# Patient Record
Sex: Female | Born: 1945 | Race: White | Hispanic: No | Marital: Married | Smoking: Former smoker
Health system: Southern US, Community
[De-identification: ages and names within clinical notes are randomized; demographics above are authoritative.]

## PROBLEM LIST (undated history)

## (undated) DIAGNOSIS — Z9109 Other allergy status, other than to drugs and biological substances: Secondary | ICD-10-CM

## (undated) DIAGNOSIS — N6310 Unspecified lump in the right breast, unspecified quadrant: Secondary | ICD-10-CM

## (undated) DIAGNOSIS — T7840XA Allergy, unspecified, initial encounter: Secondary | ICD-10-CM

## (undated) DIAGNOSIS — E875 Hyperkalemia: Secondary | ICD-10-CM

## (undated) DIAGNOSIS — M1990 Unspecified osteoarthritis, unspecified site: Secondary | ICD-10-CM

## (undated) DIAGNOSIS — I10 Essential (primary) hypertension: Secondary | ICD-10-CM

## (undated) DIAGNOSIS — R928 Other abnormal and inconclusive findings on diagnostic imaging of breast: Secondary | ICD-10-CM

## (undated) DIAGNOSIS — E785 Hyperlipidemia, unspecified: Secondary | ICD-10-CM

## (undated) DIAGNOSIS — R002 Palpitations: Secondary | ICD-10-CM

## (undated) HISTORY — DX: Other allergy status, other than to drugs and biological substances: Z91.09

## (undated) HISTORY — DX: Essential (primary) hypertension: I10

## (undated) HISTORY — PX: WISDOM TOOTH EXTRACTION: SHX21

## (undated) HISTORY — DX: Other abnormal and inconclusive findings on diagnostic imaging of breast: R92.8

## (undated) HISTORY — DX: Unspecified lump in the right breast, unspecified quadrant: N63.10

## (undated) HISTORY — DX: Allergy, unspecified, initial encounter: T78.40XA

## (undated) HISTORY — DX: Palpitations: R00.2

## (undated) HISTORY — DX: Hyperlipidemia, unspecified: E78.5

## (undated) HISTORY — DX: Hyperkalemia: E87.5

---

## 2016-08-26 DEATH — deceased

## 2020-12-08 ENCOUNTER — Other Ambulatory Visit: Payer: Self-pay

## 2020-12-08 ENCOUNTER — Ambulatory Visit: Payer: Medicare Other | Admitting: Internal Medicine

## 2020-12-08 ENCOUNTER — Encounter: Payer: Self-pay | Admitting: Internal Medicine

## 2020-12-08 VITALS — BP 142/80 | HR 57 | Temp 96.3°F | Ht 65.0 in | Wt 159.0 lb

## 2020-12-08 DIAGNOSIS — I10 Essential (primary) hypertension: Secondary | ICD-10-CM | POA: Diagnosis not present

## 2020-12-08 DIAGNOSIS — E7849 Other hyperlipidemia: Secondary | ICD-10-CM | POA: Diagnosis not present

## 2020-12-10 ENCOUNTER — Other Ambulatory Visit (HOSPITAL_BASED_OUTPATIENT_CLINIC_OR_DEPARTMENT_OTHER): Payer: Self-pay

## 2020-12-10 MED ORDER — ROSUVASTATIN CALCIUM 5 MG PO TABS
ORAL_TABLET | ORAL | 3 refills | Status: DC
  Filled 2021-07-15: qty 90, 90d supply, fill #0

## 2020-12-10 MED ORDER — OLMESARTAN MEDOXOMIL 5 MG PO TABS
ORAL_TABLET | ORAL | 3 refills | Status: DC
  Filled 2020-12-10: qty 90, 90d supply, fill #0
  Filled 2021-07-27: qty 90, 90d supply, fill #1

## 2020-12-10 MED ORDER — NYSTATIN 100000 UNIT/GM EX OINT: TOPICAL_OINTMENT | CUTANEOUS | 2 refills | Status: DC

## 2020-12-11 ENCOUNTER — Other Ambulatory Visit (HOSPITAL_BASED_OUTPATIENT_CLINIC_OR_DEPARTMENT_OTHER): Payer: Self-pay

## 2020-12-14 NOTE — Progress Notes (Signed)
Location:   [facility] features editor of Service:   Clinic  Provider: Einar Crow, MD  Code Status: DNR Goals of Care: No flowsheet data found.   Chief Complaint  Patient presents with   Medical Management of Chronic Issues    Patient here today to establish care    HPI: Patient is a 75 y.o. female seen today for medical management of chronic diseases.   Patient has a history of hypertension and hyperlipidemia Recent move to [facility] [place] Lives with her husband who has a lot of chronic issues and needs her help with transfers. She states that she has some pain in her back and shoulder because of that Also wants to know if she can get a referral to dermatology for her skin Gets mammogram every 2 to 3 years No other complaints.  Very active independent in ADLs and IADLs still drives.  No recent falls  Past Medical History:  Diagnosis Date   Abnormal mammogram    Allergic reaction to substance    Allergy    Environmental allergies    Hyperkalemia    Hyperlipidemia    Hypertension    Intermittent palpitations    Mass of right breast on mammogram    Palpitation     History reviewed. No pertinent surgical history.  No Known Allergies  Outpatient Encounter Medications as of 12/08/2020  Medication Sig   olmesartan (BENICAR) 5 MG tablet Take 5 mg by mouth daily.   rosuvastatin (CRESTOR) 5 MG tablet Take 5 mg by mouth daily.   vitamin B-12 (CYANOCOBALAMIN) 100 MCG tablet Take 100 mcg by mouth daily.   vitamin E 200 UNIT capsule Take 200 Units by mouth daily.   No facility-administered encounter medications on file as of 12/08/2020.    Review of Systems:  Review of Systems Review of Systems  Constitutional: Negative for activity change, appetite change, chills, diaphoresis, fatigue and fever.  HENT: Negative for mouth sores, postnasal drip, rhinorrhea, sinus pain and sore throat.   Respiratory: Negative for apnea, cough, chest tightness,  shortness of breath and wheezing.   Cardiovascular: Negative for chest pain, palpitations and leg swelling.  Gastrointestinal: Negative for abdominal distention, abdominal pain, constipation, diarrhea, nausea and vomiting.  Genitourinary: Negative for dysuria and frequency.  Musculoskeletal: Negative for arthralgias, joint swelling and myalgias.  Skin: Negative for rash.  Neurological: Negative for dizziness, syncope, weakness, light-headedness and numbness.  Psychiatric/Behavioral: Negative for behavioral problems, confusion and sleep disturbance.    Health Maintenance  Topic Date Due   COVID-19 Vaccine (1) Never done   Hepatitis C Screening  Never done   TETANUS/TDAP  Never done   Zoster Vaccines- Shingrix (1 of 2) Never done   DEXA SCAN  Never done   PNA vac Low Risk Adult (1 of 2 - PCV13) Never done   INFLUENZA VACCINE  01/26/2021   HPV VACCINES  Aged Out    Physical Exam: Vitals:   12/08/20 1407  BP: (!) 142/80  Pulse: (!) 57  Temp: (!) 96.3 F (35.7 C)  SpO2: 96%  Weight: 159 lb (72.1 kg)  Height: 5\' 5"  (1.651 m)   Body mass index is 26.46 kg/m. Physical Exam Constitutional: Oriented to person, place, and time. Well-developed and well-nourished.  HENT:  Head: Normocephalic.  Mouth/Throat: Oropharynx is clear and moist.  Eyes: Pupils are equal, round, and reactive to light.  Neck: Neck supple.  Cardiovascular: Normal rate and normal heart sounds.  No murmur heard. Pulmonary/Chest: Effort normal  and breath sounds normal. No respiratory distress. No wheezes. She has no rales.  Abdominal: Soft. Bowel sounds are normal. No distension. There is no tenderness. There is no rebound.  Musculoskeletal: No edema.  Lymphadenopathy: none Neurological: Alert and oriented to person, place, and time.  Skin: Skin is warm and dry.  Psychiatric: Normal mood and affect. Behavior is normal. Thought content normal.   Labs reviewed: Basic Metabolic Panel: No results for input(s):  NA, K, CL, CO2, GLUCOSE, BUN, CREATININE, CALCIUM, MG, PHOS, TSH in the last 8760 hours. Liver Function Tests: No results for input(s): AST, ALT, ALKPHOS, BILITOT, PROT, ALBUMIN in the last 8760 hours. No results for input(s): LIPASE, AMYLASE in the last 8760 hours. No results for input(s): AMMONIA in the last 8760 hours. CBC: No results for input(s): WBC, NEUTROABS, HGB, HCT, MCV, PLT in the last 8760 hours. Lipid Panel: No results for input(s): CHOL, HDL, LDLCALC, TRIG, CHOLHDL, LDLDIRECT in the last 8760 hours. No results found for: HGBA1C  Procedures since last visit: No results found.  Assessment/Plan  Primary hypertension Mildly high as she did not take her meds today Continue Benicar for now  HLD On Crestor ACP Patient wants to be DNR  Referal to dermatology in facility made Labs ordered Needs Record of her Immunization   Labs/tests ordered:  * No order type specified * Next appt:  Visit date not found

## 2021-01-14 ENCOUNTER — Other Ambulatory Visit (HOSPITAL_BASED_OUTPATIENT_CLINIC_OR_DEPARTMENT_OTHER): Payer: Self-pay

## 2021-01-14 MED ORDER — ZEASORB-AF 2 % EX POWD
Freq: Every day | CUTANEOUS | 6 refills | Status: DC
  Filled 2021-01-14: qty 71, 14d supply, fill #0
  Filled 2021-05-04: qty 71, 14d supply, fill #1
  Filled 2021-11-13: qty 71, 14d supply, fill #2

## 2021-01-14 MED ORDER — KETOCONAZOLE 2 % EX CREA
TOPICAL_CREAM | CUTANEOUS | 2 refills | Status: DC
  Filled 2021-01-14: qty 30, 14d supply, fill #0

## 2021-01-14 MED ORDER — HYDROCORTISONE 2.5 % EX CREA
TOPICAL_CREAM | CUTANEOUS | 1 refills | Status: DC
  Filled 2021-01-14: qty 30, 14d supply, fill #0
  Filled 2021-11-22: qty 30, 14d supply, fill #1

## 2021-03-11 ENCOUNTER — Other Ambulatory Visit (HOSPITAL_BASED_OUTPATIENT_CLINIC_OR_DEPARTMENT_OTHER): Payer: Self-pay

## 2021-03-11 MED ORDER — AMMONIUM LACTATE 12 % EX LOTN
TOPICAL_LOTION | CUTANEOUS | 5 refills | Status: DC
  Filled 2021-03-11: qty 226, 14d supply, fill #0

## 2021-03-11 MED ORDER — CERAVE EX CREA: TOPICAL_CREAM | CUTANEOUS | 5 refills | Status: DC

## 2021-03-12 ENCOUNTER — Other Ambulatory Visit (HOSPITAL_BASED_OUTPATIENT_CLINIC_OR_DEPARTMENT_OTHER): Payer: Self-pay

## 2021-03-19 ENCOUNTER — Other Ambulatory Visit (HOSPITAL_BASED_OUTPATIENT_CLINIC_OR_DEPARTMENT_OTHER): Payer: Self-pay

## 2021-03-23 ENCOUNTER — Other Ambulatory Visit (HOSPITAL_BASED_OUTPATIENT_CLINIC_OR_DEPARTMENT_OTHER): Payer: Self-pay

## 2021-03-30 ENCOUNTER — Other Ambulatory Visit (HOSPITAL_BASED_OUTPATIENT_CLINIC_OR_DEPARTMENT_OTHER): Payer: Self-pay

## 2021-04-03 ENCOUNTER — Other Ambulatory Visit (HOSPITAL_BASED_OUTPATIENT_CLINIC_OR_DEPARTMENT_OTHER): Payer: Self-pay

## 2021-04-29 ENCOUNTER — Other Ambulatory Visit: Payer: Self-pay

## 2021-05-04 ENCOUNTER — Other Ambulatory Visit (HOSPITAL_BASED_OUTPATIENT_CLINIC_OR_DEPARTMENT_OTHER): Payer: Self-pay

## 2021-05-13 ENCOUNTER — Other Ambulatory Visit (HOSPITAL_BASED_OUTPATIENT_CLINIC_OR_DEPARTMENT_OTHER): Payer: Self-pay

## 2021-05-13 MED ORDER — NYSTATIN 100000 UNIT/GM EX CREA
TOPICAL_CREAM | CUTANEOUS | 0 refills | Status: DC
  Filled 2021-05-13: qty 45, 45d supply, fill #0

## 2021-05-25 ENCOUNTER — Other Ambulatory Visit (HOSPITAL_BASED_OUTPATIENT_CLINIC_OR_DEPARTMENT_OTHER): Payer: Self-pay

## 2021-06-08 ENCOUNTER — Other Ambulatory Visit: Payer: Self-pay

## 2021-06-08 ENCOUNTER — Encounter: Payer: Self-pay | Admitting: Adult Health

## 2021-06-08 ENCOUNTER — Non-Acute Institutional Stay: Payer: Medicare Other | Admitting: Adult Health

## 2021-06-08 VITALS — BP 124/86 | HR 71 | Temp 96.3°F | Ht 65.0 in | Wt 164.2 lb

## 2021-06-08 DIAGNOSIS — H9313 Tinnitus, bilateral: Secondary | ICD-10-CM

## 2021-06-08 DIAGNOSIS — J392 Other diseases of pharynx: Secondary | ICD-10-CM

## 2021-06-08 DIAGNOSIS — T148XXA Other injury of unspecified body region, initial encounter: Secondary | ICD-10-CM

## 2021-06-08 DIAGNOSIS — E7849 Other hyperlipidemia: Secondary | ICD-10-CM

## 2021-06-08 DIAGNOSIS — I10 Essential (primary) hypertension: Secondary | ICD-10-CM

## 2021-06-08 NOTE — Progress Notes (Signed)
Location: [facility]  POS: clinic  Provider:  Peggye Ley, ANP [facility] [phone]   Code Status: DNR  Goals of Care: No flowsheet data found.   Chief Complaint  Patient presents with   Medical Management of Chronic Issues    Patient returns to the clinic for her 6 month follow up.      HPI: Patient is a 75 y.o. female seen today for medical management of chronic diseases.    PMH significant for HTN and HLD.   She is having some ringing in her ears not sure for how long, maybe a year, mostly at night. No hearing aides and no hearing loss.  She a mild cold with nasal congestion. Neg covid.  She is concerned that her husband has covid and he had a fall. She is adjusting to living alone while her husband lives in skilled care.   Reports that while pushing her husband in the [facility] she pulled a muscle in her right leg and there is some soreness there.    Past Medical History:  Diagnosis Date   Abnormal mammogram    Allergic reaction to substance    Allergy    Environmental allergies    Hyperkalemia    Hyperlipidemia    Hypertension    Intermittent palpitations    Mass of right breast on mammogram    Palpitation     History reviewed. No pertinent surgical history.  No Known Allergies  Outpatient Encounter Medications as of 06/08/2021  Medication Sig   ammonium lactate (LAC-HYDRIN) 12 % lotion Apply topically.   Emollient (CERAVE) CREA Apply to face and body 1-2 times daily   hydrocortisone 2.5 % cream Cleanse affected areas with gentle nonperfumed cleanser, pat dry or use cool dryer, then apply peasize amount twice a day for two weeks then daily as needed with kenoconazole.   ketoconazole (NIZORAL) 2 % cream Cleanse with gentle cleanser and washcloth and dry completely then apply to afffected area peasize amount twice a day under breasts and groin for 2 weeks and with flares   miconazole (ZEASORB-AF) 2 % powder Apply daily to affected area.    nystatin cream (MYCOSTATIN) Apply to affected area twice daily for 2 weeks or until improved; can use as needed afterwards.   nystatin ointment (MYCOSTATIN) Apply on affected areas under the breasts and groin area twice daily   olmesartan (BENICAR) 5 MG tablet Take 1 tablet by mouth daily   rosuvastatin (CRESTOR) 5 MG tablet Take 1 tablet by mouth everyday at bedtime   vitamin B-12 (CYANOCOBALAMIN) 100 MCG tablet Take 100 mcg by mouth daily.   vitamin E 200 UNIT capsule Take 200 Units by mouth daily.   [DISCONTINUED] olmesartan (BENICAR) 5 MG tablet Take 5 mg by mouth daily.   [DISCONTINUED] rosuvastatin (CRESTOR) 5 MG tablet Take 5 mg by mouth daily.   No facility-administered encounter medications on file as of 06/08/2021.    Review of Systems:  Review of Systems  Constitutional:  Negative for activity change, appetite change, chills, diaphoresis, fatigue, fever and unexpected weight change.  HENT:  Positive for congestion.        Dry throat  Respiratory:  Negative for cough, shortness of breath and wheezing.   Cardiovascular:  Negative for chest pain, palpitations and leg swelling.  Gastrointestinal:  Negative for abdominal distention, abdominal pain, constipation and diarrhea.  Genitourinary:  Negative for difficulty urinating and dysuria.  Musculoskeletal:  Negative for arthralgias, back pain, gait problem and joint swelling.  Right upper leg pain  Neurological:  Negative for dizziness, tremors, seizures, syncope, facial asymmetry, speech difficulty, weakness, light-headedness, numbness and headaches.  Psychiatric/Behavioral:  Negative for agitation, behavioral problems and confusion.    Health Maintenance  Topic Date Due   Hepatitis C Screening  Never done   TETANUS/TDAP  12/08/2016   DEXA SCAN  06/08/2024 (Originally 08/31/2008)   Pneumonia Vaccine 62+ Years old  Completed   INFLUENZA VACCINE  Completed   COVID-19 Vaccine  Completed   Zoster Vaccines- Shingrix  Completed    HPV VACCINES  Aged Out    Physical Exam: Vitals:   06/08/21 1353  BP: 124/86  Pulse: 71  Temp: (!) 96.3 F (35.7 C)  SpO2: 94%  Weight: 164 lb 3.2 oz (74.5 kg)  Height: 5\' 5"  (1.651 m)   Body mass index is 27.32 kg/m. Physical Exam Vitals and nursing note reviewed.  Constitutional:      General: She is not in acute distress.    Appearance: She is not diaphoretic.  HENT:     Head: Normocephalic and atraumatic.     Right Ear: Tympanic membrane normal.     Left Ear: Tympanic membrane normal.     Ears:     Comments: Mild erythema to surrounding TM     Nose: Congestion present.     Mouth/Throat:     Mouth: Mucous membranes are moist.     Pharynx: Oropharynx is clear.  Eyes:     Conjunctiva/sclera: Conjunctivae normal.     Pupils: Pupils are equal, round, and reactive to light.  Neck:     Vascular: No JVD.  Cardiovascular:     Rate and Rhythm: Normal rate and regular rhythm.     Heart sounds: No murmur heard. Pulmonary:     Effort: Pulmonary effort is normal. No respiratory distress.     Breath sounds: Normal breath sounds. No wheezing.  Abdominal:     General: Bowel sounds are normal. There is no distension.     Palpations: Abdomen is soft. There is no mass.     Tenderness: There is no abdominal tenderness. There is no right CVA tenderness or guarding.  Musculoskeletal:     Cervical back: No rigidity or tenderness.     Right lower leg: No edema.     Left lower leg: No edema.  Lymphadenopathy:     Cervical: No cervical adenopathy.  Skin:    General: Skin is warm and dry.  Neurological:     Mental Status: She is alert and oriented to person, place, and time.  Psychiatric:        Mood and Affect: Mood normal.    Labs reviewed: Basic Metabolic Panel: No results for input(s): NA, K, CL, CO2, GLUCOSE, BUN, CREATININE, CALCIUM, MG, PHOS, TSH in the last 8760 hours. Liver Function Tests: No results for input(s): AST, ALT, ALKPHOS, BILITOT, PROT, ALBUMIN in the  last 8760 hours. No results for input(s): LIPASE, AMYLASE in the last 8760 hours. No results for input(s): AMMONIA in the last 8760 hours. CBC: No results for input(s): WBC, NEUTROABS, HGB, HCT, MCV, PLT in the last 8760 hours. Lipid Panel: No results for input(s): CHOL, HDL, LDLCALC, TRIG, CHOLHDL, LDLDIRECT in the last 8760 hours. No results found for: HGBA1C  Procedures since last visit: No results found.  Assessment/Plan  1. Primary hypertension Controlled Continue Benicar  2. Other hyperlipidemia Check lipid  Continue crestor   3. Tinnitus of both ears No cerumen impaction or infection noted. Mild fluid  from current URI but the issue has been present for over a year.  - Ambulatory referral to ENT  4. Muscle strain To right groin area  Continues stretching  Try advil OTC for 3-5 days with food then stop If not improving RTC  5. Dry throat No signs of reflux, may be due to long periods of reading to her husband   Labs/tests ordered:  * No order type specified * CBC CMP TSH lipid this week Next appt:  F/U 6 months   Total time :  time greater than 50% of total time spent doing pt counseling and coordination of care   She will verify her vaccination records and get back with [place]

## 2021-06-09 ENCOUNTER — Other Ambulatory Visit (HOSPITAL_BASED_OUTPATIENT_CLINIC_OR_DEPARTMENT_OTHER): Payer: Self-pay

## 2021-06-11 LAB — BASIC METABOLIC PANEL
BUN: 14 (ref 4–21)
CO2: 25 — AB (ref 13–22)
Chloride: 104 (ref 99–108)
Creatinine: 0.8 (ref 0.5–1.1)
Glucose: 102
Potassium: 4.2 mEq/L (ref 3.5–5.1)
Sodium: 141 (ref 137–147)

## 2021-06-11 LAB — LIPID PANEL
Cholesterol: 144 (ref 0–200)
HDL: 36 (ref 35–70)
LDL Cholesterol: 77
Triglycerides: 152 (ref 40–160)

## 2021-06-11 LAB — CBC AND DIFFERENTIAL
HCT: 44 (ref 36–46)
Hemoglobin: 14.2 (ref 12.0–16.0)
Platelets: 186 10*3/uL (ref 150–400)
WBC: 4.2

## 2021-06-11 LAB — COMPREHENSIVE METABOLIC PANEL
Albumin: 4.4 (ref 3.5–5.0)
Calcium: 9.1 (ref 8.7–10.7)
Globulin: 1.9

## 2021-06-11 LAB — CBC: RBC: 4.8 (ref 3.87–5.11)

## 2021-06-11 LAB — HEPATIC FUNCTION PANEL
ALT: 26 U/L (ref 7–35)
AST: 22 (ref 13–35)
Alkaline Phosphatase: 87 (ref 25–125)
Bilirubin, Total: 0.7

## 2021-06-11 LAB — TSH: TSH: 5.49 (ref 0.41–5.90)

## 2021-07-14 ENCOUNTER — Other Ambulatory Visit (HOSPITAL_BASED_OUTPATIENT_CLINIC_OR_DEPARTMENT_OTHER): Payer: Self-pay

## 2021-07-14 MED ORDER — TRIAMCINOLONE ACETONIDE 0.1 % EX CREA
TOPICAL_CREAM | CUTANEOUS | 1 refills | Status: DC
  Filled 2021-07-14: qty 30, 14d supply, fill #0
  Filled 2021-11-22: qty 30, 14d supply, fill #1

## 2021-07-14 MED ORDER — TACROLIMUS 0.1 % EX OINT
TOPICAL_OINTMENT | CUTANEOUS | 1 refills | Status: DC
  Filled 2021-07-14: qty 30, 14d supply, fill #0

## 2021-07-15 ENCOUNTER — Other Ambulatory Visit (HOSPITAL_BASED_OUTPATIENT_CLINIC_OR_DEPARTMENT_OTHER): Payer: Self-pay

## 2021-07-22 ENCOUNTER — Other Ambulatory Visit (HOSPITAL_BASED_OUTPATIENT_CLINIC_OR_DEPARTMENT_OTHER): Payer: Self-pay

## 2021-07-27 ENCOUNTER — Other Ambulatory Visit (HOSPITAL_BASED_OUTPATIENT_CLINIC_OR_DEPARTMENT_OTHER): Payer: Self-pay

## 2021-08-11 ENCOUNTER — Other Ambulatory Visit: Payer: Self-pay | Admitting: Physician Assistant

## 2021-08-11 ENCOUNTER — Ambulatory Visit
Admission: RE | Admit: 2021-08-11 | Discharge: 2021-08-11 | Disposition: A | Discharge status: Home / Self Care | Payer: Medicare Other | Source: Ambulatory Visit | Attending: Physician Assistant | Admitting: Physician Assistant

## 2021-08-11 DIAGNOSIS — M25551 Pain in right hip: Secondary | ICD-10-CM

## 2021-10-26 ENCOUNTER — Other Ambulatory Visit: Payer: Self-pay

## 2021-11-12 ENCOUNTER — Other Ambulatory Visit (HOSPITAL_BASED_OUTPATIENT_CLINIC_OR_DEPARTMENT_OTHER): Payer: Self-pay

## 2021-11-12 MED ORDER — SODIUM FLUORIDE 1.1 % DT PSTE
PASTE | DENTAL | 4 refills | Status: AC
  Filled 2021-11-12: qty 100, 30d supply, fill #0

## 2021-11-13 ENCOUNTER — Other Ambulatory Visit (HOSPITAL_BASED_OUTPATIENT_CLINIC_OR_DEPARTMENT_OTHER): Payer: Self-pay

## 2021-11-13 ENCOUNTER — Other Ambulatory Visit: Payer: Self-pay | Admitting: Internal Medicine

## 2021-11-13 MED ORDER — OLMESARTAN MEDOXOMIL 5 MG PO TABS
ORAL_TABLET | ORAL | 3 refills | Status: DC
  Filled 2021-11-13: qty 90, 90d supply, fill #0
  Filled 2022-02-25: qty 90, 90d supply, fill #1
  Filled 2022-06-08: qty 90, 90d supply, fill #2
  Filled 2022-09-10: qty 90, 90d supply, fill #3

## 2021-11-13 MED ORDER — ROSUVASTATIN CALCIUM 5 MG PO TABS
ORAL_TABLET | ORAL | 3 refills | Status: DC
  Filled 2021-11-13: qty 90, 90d supply, fill #0
  Filled 2022-02-25: qty 90, 90d supply, fill #1
  Filled 2022-06-08: qty 90, 90d supply, fill #2
  Filled 2022-09-10: qty 90, 90d supply, fill #3

## 2021-11-13 NOTE — Telephone Encounter (Signed)
Patient has request refill on medications Olmesartan last refill 09/04/2020, and Crestor last refill 09/16/2020. Medications havent been refilled by PCP Mahlon Gammon, MD . Medications pend and sent to Fletcher Anon, NP for approval.

## 2021-11-24 ENCOUNTER — Other Ambulatory Visit (HOSPITAL_BASED_OUTPATIENT_CLINIC_OR_DEPARTMENT_OTHER): Payer: Self-pay

## 2021-12-07 ENCOUNTER — Other Ambulatory Visit (HOSPITAL_BASED_OUTPATIENT_CLINIC_OR_DEPARTMENT_OTHER): Payer: Self-pay

## 2021-12-08 ENCOUNTER — Encounter: Payer: Self-pay | Admitting: Internal Medicine

## 2021-12-09 ENCOUNTER — Encounter: Payer: Self-pay | Admitting: Internal Medicine

## 2021-12-09 ENCOUNTER — Non-Acute Institutional Stay (INDEPENDENT_AMBULATORY_CARE_PROVIDER_SITE_OTHER): Payer: Self-pay | Admitting: Internal Medicine

## 2021-12-09 VITALS — Ht 65.0 in

## 2021-12-09 DIAGNOSIS — Z91199 Patient's noncompliance with other medical treatment and regimen due to unspecified reason: Secondary | ICD-10-CM

## 2021-12-11 NOTE — Progress Notes (Signed)
No Show

## 2022-02-25 ENCOUNTER — Other Ambulatory Visit (HOSPITAL_BASED_OUTPATIENT_CLINIC_OR_DEPARTMENT_OTHER): Payer: Self-pay

## 2022-03-30 ENCOUNTER — Other Ambulatory Visit (HOSPITAL_BASED_OUTPATIENT_CLINIC_OR_DEPARTMENT_OTHER): Payer: Self-pay

## 2022-03-30 MED ORDER — FLUAD QUADRIVALENT 0.5 ML IM PRSY
PREFILLED_SYRINGE | INTRAMUSCULAR | 0 refills | Status: DC
Start: 2022-03-30 — End: 2023-07-07
  Filled 2022-03-30: qty 0.5, 1d supply, fill #0

## 2022-06-08 ENCOUNTER — Other Ambulatory Visit: Payer: Self-pay

## 2022-07-14 ENCOUNTER — Other Ambulatory Visit (HOSPITAL_BASED_OUTPATIENT_CLINIC_OR_DEPARTMENT_OTHER): Payer: Self-pay

## 2022-07-14 MED ORDER — PREDNISONE 10 MG PO TABS
10.0000 mg | ORAL_TABLET | ORAL | 0 refills | Status: DC
  Filled 2022-07-14: qty 18, 7d supply, fill #0

## 2022-07-22 ENCOUNTER — Other Ambulatory Visit (HOSPITAL_BASED_OUTPATIENT_CLINIC_OR_DEPARTMENT_OTHER): Payer: Self-pay

## 2022-07-22 MED ORDER — TRAMADOL HCL 50 MG PO TABS
50.0000 mg | ORAL_TABLET | Freq: Four times a day (QID) | ORAL | 0 refills | Status: DC | PRN
  Filled 2022-07-22: qty 20, 5d supply, fill #0

## 2022-07-26 ENCOUNTER — Other Ambulatory Visit (HOSPITAL_BASED_OUTPATIENT_CLINIC_OR_DEPARTMENT_OTHER): Payer: Self-pay

## 2022-07-26 MED ORDER — PREDNISONE 10 MG PO TABS
ORAL_TABLET | ORAL | 0 refills | Status: AC
  Filled 2022-07-26: qty 18, 7d supply, fill #0

## 2022-08-27 ENCOUNTER — Encounter: Payer: Self-pay | Admitting: Internal Medicine

## 2022-08-27 ENCOUNTER — Ambulatory Visit: Payer: Medicare Other | Admitting: Internal Medicine

## 2022-08-27 VITALS — BP 143/80 | HR 71 | Resp 17 | Ht 65.0 in | Wt 170.0 lb

## 2022-08-27 DIAGNOSIS — R002 Palpitations: Secondary | ICD-10-CM

## 2022-08-27 DIAGNOSIS — R9431 Abnormal electrocardiogram [ECG] [EKG]: Secondary | ICD-10-CM

## 2022-08-27 DIAGNOSIS — I10 Essential (primary) hypertension: Secondary | ICD-10-CM

## 2022-08-27 NOTE — Progress Notes (Signed)
Primary Physician/Referring:  Mahlon Gammon, MD  Patient ID: Chloe Cobb, female    DOB: 1945-12-07, 77 y.o.   MRN: [id number]  Chief Complaint  Patient presents with   Abnormal ECG   New Patient (Initial Visit)   HPI:    Chloe Cobb  is a 77 y.o. female with past medical history significant for hypertension, hyperlipidemia, and palpitations who is here to establish care with cardiology.  She was sent to [place] from her primary care doctor's office due to abnormal EKG.  One of her EKGs showed marked sinus bradycardia and the other showed possible atrial fibrillation.  Patient does complain of irregular heart rates and palpitations.  Of note heart disease does run in her family and her sister has atrial fibrillation, so does her husband so she is familiar with this.  Patient denies chest pain, shortness of breath, diaphoresis, syncope, edema, orthopnea, PND, claudication.  She has not noticed any fast heart rates that have been sustained.  Past Medical History:  Diagnosis Date   Abnormal mammogram    Allergic reaction to substance    Allergy    Environmental allergies    Hyperkalemia    Hyperlipidemia    Hypertension    Intermittent palpitations    Mass of right breast on mammogram    Palpitation    No past surgical history on file. Family History  Problem Relation Age of Onset   Heart attack Mother    Parkinson's disease Mother    Heart disease Father    Coronary artery disease Father    Heart disease Sister     Social History   Tobacco Use   Smoking status: Former    Types: Cigarettes    Quit date: 1986    Years since quitting: 38.1   Smokeless tobacco: Not on file  Substance Use Topics   Alcohol use: Yes    Comment: 7 a week   Marital Status: Married  ROS  Review of Systems  Cardiovascular:  Positive for irregular heartbeat and palpitations.   Objective  Blood pressure (!) 143/80, pulse 71, resp. rate 17, height 5\' 5"  (1.651 m), weight 170 lb (77.1 kg),  SpO2 97 %. Body mass index is 28.29 kg/m.     08/27/2022    1:17 PM 12/09/2021    8:12 AM 06/08/2021    1:53 PM  Vitals with BMI  Height 5\' 5"  5\' 5"  5\' 5"   Weight 170 lbs  164 lbs 3 oz  BMI 28.29  27.32  Systolic 143  124  Diastolic 80  86  Pulse 71  71     Physical Exam Vitals reviewed.  HENT:     Head: Normocephalic and atraumatic.  Cardiovascular:     Rate and Rhythm: Regular rhythm. Bradycardia present.     Pulses: Normal pulses.     Heart sounds: Normal heart sounds. No murmur heard. Pulmonary:     Effort: Pulmonary effort is normal.     Breath sounds: Normal breath sounds.  Abdominal:     General: Bowel sounds are normal.  Musculoskeletal:     Right lower leg: No edema.     Left lower leg: No edema.  Skin:    General: Skin is warm and dry.  Neurological:     Mental Status: She is alert.     Medications and allergies  No Known Allergies   Medication list after today's encounter   Current Outpatient Medications:    ammonium lactate (LAC-HYDRIN) 12 %  lotion, Apply topically., Disp: 226 g, Rfl: 5   Emollient (CERAVE) CREA, Apply to face and body 1-2 times daily, Disp: 454 g, Rfl: 5   hydrocortisone 2.5 % cream, Cleanse affected areas with gentle nonperfumed cleanser, pat dry or use cool dryer, then apply peasize amount twice a day for two weeks then daily as needed with kenoconazole., Disp: 30 g, Rfl: 1   influenza vaccine adjuvanted (FLUAD QUADRIVALENT) 0.5 ML injection, Inject into the muscle., Disp: 0.5 mL, Rfl: 0   ketoconazole (NIZORAL) 2 % cream, Cleanse with gentle cleanser and washcloth and dry completely then apply to afffected area peasize amount twice a day under breasts and groin for 2 weeks and with flares, Disp: 30 g, Rfl: 2   miconazole (ZEASORB-AF) 2 % powder, Apply daily to affected area., Disp: 71 g, Rfl: 6   olmesartan (BENICAR) 5 MG tablet, Take 1 tablet by mouth daily, Disp: 90 tablet, Rfl: 3   rosuvastatin (CRESTOR) 5 MG tablet, Take 1 tablet by  mouth everyday at bedtime, Disp: 90 tablet, Rfl: 3   Sodium Fluoride 1.1 % PSTE, Apply pea size amount onto brush and apply to all surfaces of teeth for 2 minutes per day., Disp: 100 mL, Rfl: 4   tacrolimus (PROTOPIC) 0.1 % ointment, Use 1 application Externally Once a day 30 days, Disp: 30 g, Rfl: 1   traMADol (ULTRAM) 50 MG tablet, Take 1 tablet (50 mg total) by mouth every 6 (six) hours as needed for pain, Disp: 20 tablet, Rfl: 0   triamcinolone cream (KENALOG) 0.1 %, 1 application topically BID 14 days, Disp: 30 g, Rfl: 1   vitamin B-12 (CYANOCOBALAMIN) 100 MCG tablet, Take 100 mcg by mouth daily., Disp: , Rfl:    vitamin E 200 UNIT capsule, Take 200 Units by mouth daily., Disp: , Rfl:   Laboratory examination:   Lab Results  Component Value Date   NA 141 06/11/2021   K 4.2 06/11/2021   CO2 25 (A) 06/11/2021   BUN 14 06/11/2021   CREATININE 0.8 06/11/2021   CALCIUM 9.1 06/11/2021       Latest Ref Rng & Units 06/11/2021   12:00 AM  CMP  BUN 4 - 21 14      Creatinine 0.5 - 1.1 0.8      Sodium 137 - 147 141      Potassium 3.5 - 5.1 mEq/L 4.2      Chloride 99 - 108 104      CO2 13 - 22 25      Calcium 8.7 - 10.7 9.1      Alkaline Phos 25 - 125 87      AST 13 - 35 22      ALT 7 - 35 U/L 26         This result is from an external source.      Latest Ref Rng & Units 06/11/2021   12:00 AM  CBC  WBC  4.2      Hemoglobin 12.0 - 16.0 14.2      Hematocrit 36 - 46 44      Platelets 150 - 400 K/uL 186         This result is from an external source.    Lipid Panel No results for input(s): "CHOL", "TRIG", "LDLCALC", "VLDL", "HDL", "CHOLHDL", "LDLDIRECT" in the last 8760 hours.  HEMOGLOBIN A1C No results found for: "HGBA1C", "MPG" TSH No results for input(s): "TSH" in the last 8760 hours.  External labs:  Radiology:    Cardiac Studies:   EKG 08/27/2022 at PCP office: sinus bradycardia rate 48 bpm. Second EKG shows possible Afib but more likely artifact  EKG:    08/27/2022: Sinus rhythm, rate 63 bpm. Leftward axis. Poor R wave progression.  Assessment     ICD-10-CM   1. Essential hypertension  I10 PCV ECHOCARDIOGRAM COMPLETE    LONG TERM MONITOR (3-14 DAYS)    2. Nonspecific abnormal electrocardiogram (ECG) (EKG)  R94.31 EKG 12-Lead    PCV ECHOCARDIOGRAM COMPLETE    LONG TERM MONITOR (3-14 DAYS)    3. Palpitations  R00.2 PCV ECHOCARDIOGRAM COMPLETE    LONG TERM MONITOR (3-14 DAYS)       Orders Placed This Encounter  Procedures   LONG TERM MONITOR (3-14 DAYS)    Standing Status:   Future    Number of Occurrences:   1    Standing Expiration Date:   08/27/2023    Order Specific Question:   Where should this test be performed?    Answer:   [facility]    Order Specific Question:   Does the patient have an implanted cardiac device?    Answer:   No    Order Specific Question:   Prescribed days of wear    Answer:   82    Order Specific Question:   Type of enrollment    Answer:   Clinic Enrollment    Order Specific Question:   Release to patient    Answer:   Immediate   EKG 12-Lead   PCV ECHOCARDIOGRAM COMPLETE    Standing Status:   Future    Standing Expiration Date:   08/27/2023    No orders of the defined types were placed in this encounter.   Medications Discontinued During This Encounter  Medication Reason   nystatin cream (MYCOSTATIN)    nystatin ointment (MYCOSTATIN)      Recommendations:   Chloe Cobb is a 77 y.o.  female with abnormal EKG and palpitations  Essential hypertension Continue current cardiac medications. Encourage low-sodium diet, less than 2000 mg daily. Will not change anything at this time as she just came rushing from PCP office Follow-up in 1-2 months or sooner if needed.   Nonspecific abnormal electrocardiogram (ECG) (EKG) Echocardiogram ordered   Palpitations I am concerned she could have Afib as her sister has it Event monitor has been ordered     Clotilde Dieter, DO,  [facility]  08/30/2022, 10:04 AM Office: [phone] Pager: [phone]

## 2022-09-02 ENCOUNTER — Ambulatory Visit: Payer: Medicare Other

## 2022-09-02 DIAGNOSIS — I10 Essential (primary) hypertension: Secondary | ICD-10-CM

## 2022-09-02 DIAGNOSIS — R002 Palpitations: Secondary | ICD-10-CM

## 2022-09-02 DIAGNOSIS — R9431 Abnormal electrocardiogram [ECG] [EKG]: Secondary | ICD-10-CM

## 2022-09-10 ENCOUNTER — Ambulatory Visit: Payer: Medicare Other | Admitting: Internal Medicine

## 2022-09-10 ENCOUNTER — Other Ambulatory Visit (HOSPITAL_BASED_OUTPATIENT_CLINIC_OR_DEPARTMENT_OTHER): Payer: Self-pay

## 2022-09-28 ENCOUNTER — Ambulatory Visit: Payer: Medicare Other

## 2022-09-28 DIAGNOSIS — I10 Essential (primary) hypertension: Secondary | ICD-10-CM

## 2022-09-28 DIAGNOSIS — R002 Palpitations: Secondary | ICD-10-CM

## 2022-09-28 DIAGNOSIS — R9431 Abnormal electrocardiogram [ECG] [EKG]: Secondary | ICD-10-CM

## 2022-09-30 NOTE — Progress Notes (Signed)
normal

## 2022-10-01 NOTE — Progress Notes (Signed)
Tried calling pt n/a

## 2022-10-05 NOTE — Progress Notes (Signed)
3rd attempt : Called patient, NA, LMAM

## 2022-10-05 NOTE — Progress Notes (Signed)
Called patient no answer left a vm to return the call back

## 2022-10-06 ENCOUNTER — Telehealth: Payer: Self-pay

## 2022-10-06 NOTE — Telephone Encounter (Signed)
Patient returned call for Echo results. She acknowledged understanding and had no further questions.

## 2022-10-11 ENCOUNTER — Ambulatory Visit: Payer: Medicare Other | Admitting: Internal Medicine

## 2022-10-15 ENCOUNTER — Ambulatory Visit: Payer: Medicare Other | Admitting: Internal Medicine

## 2022-10-18 ENCOUNTER — Encounter: Payer: Self-pay | Admitting: Internal Medicine

## 2022-10-18 ENCOUNTER — Other Ambulatory Visit (HOSPITAL_BASED_OUTPATIENT_CLINIC_OR_DEPARTMENT_OTHER): Payer: Self-pay

## 2022-10-18 ENCOUNTER — Ambulatory Visit: Payer: Medicare Other | Admitting: Internal Medicine

## 2022-10-18 VITALS — BP 103/54 | HR 56 | Ht 65.0 in | Wt 169.0 lb

## 2022-10-18 DIAGNOSIS — R002 Palpitations: Secondary | ICD-10-CM

## 2022-10-18 DIAGNOSIS — I471 Supraventricular tachycardia, unspecified: Secondary | ICD-10-CM

## 2022-10-18 DIAGNOSIS — I10 Essential (primary) hypertension: Secondary | ICD-10-CM

## 2022-10-18 MED ORDER — METOPROLOL SUCCINATE ER 25 MG PO TB24
25.0000 mg | ORAL_TABLET | Freq: Every day | ORAL | 3 refills | Status: DC
  Filled 2022-10-18: qty 90, 90d supply, fill #0

## 2022-10-18 NOTE — Progress Notes (Unsigned)
Primary Physician/Referring:  Aliene Beams, MD  Patient ID: Chloe Cobb, female    DOB: March 20, 1946, 77 y.o.   MRN: [id number]  Chief Complaint  Patient presents with   Hypertension   Follow-up   Results   HPI:    Chloe Cobb  is a 77 y.o. female with past medical history significant for hypertension, hyperlipidemia, and palpitations who is here to establish care with cardiology.  She was sent to [place] from her primary care doctor's office due to abnormal EKG.  One of her EKGs showed marked sinus bradycardia and the other showed possible atrial fibrillation.  Patient does complain of irregular heart rates and palpitations.  Of note heart disease does run in her family and her sister has atrial fibrillation, so does her husband so she is familiar with this.  Patient denies chest pain, shortness of breath, diaphoresis, syncope, edema, orthopnea, PND, claudication.  She has not noticed any fast heart rates that have been sustained.  Past Medical History:  Diagnosis Date   Abnormal mammogram    Allergic reaction to substance    Allergy    Environmental allergies    Hyperkalemia    Hyperlipidemia    Hypertension    Intermittent palpitations    Mass of right breast on mammogram    Palpitation    History reviewed. No pertinent surgical history. Family History  Problem Relation Age of Onset   Heart attack Mother    Parkinson's disease Mother    Heart disease Father    Coronary artery disease Father    Heart disease Sister     Social History   Tobacco Use   Smoking status: Former    Types: Cigarettes    Quit date: 1986    Years since quitting: 38.3   Smokeless tobacco: Not on file  Substance Use Topics   Alcohol use: Yes    Comment: 7 a week   Marital Status: Married  ROS  Review of Systems  Cardiovascular:  Positive for irregular heartbeat and palpitations.   Objective  Blood pressure (!) 103/54, pulse (!) 56, height 5\' 5"  (1.651 m), weight 169 lb (76.7 kg),  SpO2 95 %. Body mass index is 28.12 kg/m.     10/18/2022    2:40 PM 10/18/2022    2:33 PM 08/27/2022    1:17 PM  Vitals with BMI  Height  5\' 5"  5\' 5"   Weight  169 lbs 170 lbs  BMI  28.12 28.29  Systolic 103 99 143  Diastolic 54 55 80  Pulse 56 54 71     Physical Exam Vitals reviewed.  HENT:     Head: Normocephalic and atraumatic.  Cardiovascular:     Rate and Rhythm: Regular rhythm. Bradycardia present.     Pulses: Normal pulses.     Heart sounds: Normal heart sounds. No murmur heard. Pulmonary:     Effort: Pulmonary effort is normal.     Breath sounds: Normal breath sounds.  Abdominal:     General: Bowel sounds are normal.  Musculoskeletal:     Right lower leg: No edema.     Left lower leg: No edema.  Skin:    General: Skin is warm and dry.  Neurological:     Mental Status: She is alert.     Medications and allergies   Allergies  Allergen Reactions   Beeswax     Other Reaction(s): rash, swelling   Hydrogen Peroxide     Other Reaction(s): facial swelling  Other Swelling    DUST     Medication list after today's encounter   Current Outpatient Medications:    ammonium lactate (LAC-HYDRIN) 12 % lotion, Apply topically., Disp: 226 g, Rfl: 5   Emollient (CERAVE) CREA, Apply to face and body 1-2 times daily, Disp: 454 g, Rfl: 5   hydrocortisone 2.5 % cream, Cleanse affected areas with gentle nonperfumed cleanser, pat dry or use cool dryer, then apply peasize amount twice a day for two weeks then daily as needed with kenoconazole., Disp: 30 g, Rfl: 1   influenza vaccine adjuvanted (FLUAD QUADRIVALENT) 0.5 ML injection, Inject into the muscle., Disp: 0.5 mL, Rfl: 0   ketoconazole (NIZORAL) 2 % cream, Cleanse with gentle cleanser and washcloth and dry completely then apply to afffected area peasize amount twice a day under breasts and groin for 2 weeks and with flares, Disp: 30 g, Rfl: 2   metoprolol succinate (TOPROL XL) 25 MG 24 hr tablet, Take 1 tablet (25 mg total)  by mouth at bedtime., Disp: 90 tablet, Rfl: 3   miconazole (ZEASORB-AF) 2 % powder, Apply daily to affected area., Disp: 71 g, Rfl: 6   predniSONE (DELTASONE) 10 MG tablet, Take 10 mg by mouth daily as needed., Disp: , Rfl:    rosuvastatin (CRESTOR) 5 MG tablet, Take 1 tablet by mouth everyday at bedtime, Disp: 90 tablet, Rfl: 3   Sodium Fluoride 1.1 % PSTE, Apply pea size amount onto brush and apply to all surfaces of teeth for 2 minutes per day., Disp: 100 mL, Rfl: 4   tacrolimus (PROTOPIC) 0.1 % ointment, Use 1 application Externally Once a day 30 days, Disp: 30 g, Rfl: 1   traMADol (ULTRAM) 50 MG tablet, Take 1 tablet (50 mg total) by mouth every 6 (six) hours as needed for pain, Disp: 20 tablet, Rfl: 0   triamcinolone cream (KENALOG) 0.1 %, 1 application topically BID 14 days, Disp: 30 g, Rfl: 1   vitamin B-12 (CYANOCOBALAMIN) 100 MCG tablet, Take 100 mcg by mouth daily., Disp: , Rfl:    vitamin E 200 UNIT capsule, Take 200 Units by mouth daily., Disp: , Rfl:   Laboratory examination:   Lab Results  Component Value Date   NA 141 06/11/2021   K 4.2 06/11/2021   CO2 25 (A) 06/11/2021   BUN 14 06/11/2021   CREATININE 0.8 06/11/2021   CALCIUM 9.1 06/11/2021       Latest Ref Rng & Units 06/11/2021   12:00 AM  CMP  BUN 4 - 21 14      Creatinine 0.5 - 1.1 0.8      Sodium 137 - 147 141      Potassium 3.5 - 5.1 mEq/L 4.2      Chloride 99 - 108 104      CO2 13 - 22 25      Calcium 8.7 - 10.7 9.1      Alkaline Phos 25 - 125 87      AST 13 - 35 22      ALT 7 - 35 U/L 26         This result is from an external source.      Latest Ref Rng & Units 06/11/2021   12:00 AM  CBC  WBC  4.2      Hemoglobin 12.0 - 16.0 14.2      Hematocrit 36 - 46 44      Platelets 150 - 400 K/uL 186  This result is from an external source.    Lipid Panel No results for input(s): "CHOL", "TRIG", "LDLCALC", "VLDL", "HDL", "CHOLHDL", "LDLDIRECT" in the last 8760 hours.  HEMOGLOBIN A1C No  results found for: "HGBA1C", "MPG" TSH No results for input(s): "TSH" in the last 8760 hours.  External labs:     Radiology:    Cardiac Studies:   EKG 08/27/2022 at PCP office: sinus bradycardia rate 48 bpm. Second EKG shows possible Afib but more likely artifact  Echocardiogram 09/28/2022:  Normal LV systolic function with visual EF 60-65%. Left ventricle cavity  is normal in size. Normal left ventricular wall thickness. Normal global  wall motion. Normal diastolic filling pattern, normal LAP. Calculated EF  68%.  Structurally normal tricuspid valve with no regurgitation. No evidence of  pulmonary hypertension.  No prior available for comparison.    Patch Wear Time:  13 days and 23 hours (2024-03-07T10:18:28-0500 to 2024-03-21T11:18:20-0400)  Patient had a min HR of 40 bpm, max HR of 188 bpm, and avg HR of 62 bpm. Predominant underlying rhythm was Sinus Rhythm. 1 run of Ventricular Tachycardia occurred lasting 5 beats with a max rate of 162 bpm (avg 143 bpm). 1164 Supraventricular Tachycardia runs occurred, the run with the fastest interval lasting 12 beats with a max rate of 188 bpm, the longest lasting 2 mins 14 secs with an avg rate of 136 bpm. True duration of Supraventricular Tachycardia difficult to ascertain due to artifact. Isolated  SVEs were occasional (1.9%, 19292), SVE Couplets were rare (<1.0%, 2682), and SVE Triplets were rare (<1.0%, 635). Isolated VEs were rare (<1.0%, 4057), VE Couplets were rare (<1.0%, 3), and VE Triplets were rare (<1.0%, 2). Ventricular Bigeminy and Trigeminy were present. Difficulty discerning atrial activity making definitive diagnosis difficult to ascertain.   EKG:   08/27/2022: Sinus rhythm, rate 63 bpm. Leftward axis. Poor R wave progression.  Assessment     ICD-10-CM   1. Essential hypertension  I10     2. Palpitations  R00.2 Ambulatory referral to Cardiac Electrophysiology    3. SVT (supraventricular tachycardia)  I47.10 Ambulatory  referral to Cardiac Electrophysiology       Orders Placed This Encounter  Procedures   Ambulatory referral to Cardiac Electrophysiology    Referral Priority:   Routine    Referral Type:   Consultation    Referral Reason:   Specialty Services Required    Referred to Provider:   Duke Salvia, MD    Requested Specialty:   Cardiology    Number of Visits Requested:   1    Meds ordered this encounter  Medications   metoprolol succinate (TOPROL XL) 25 MG 24 hr tablet    Sig: Take 1 tablet (25 mg total) by mouth at bedtime.    Dispense:  90 tablet    Refill:  3    Medications Discontinued During This Encounter  Medication Reason   olmesartan (BENICAR) 5 MG tablet       Recommendations:   Chloe Cobb is a 77 y.o.  female with abnormal EKG and palpitations  Essential hypertension Stop Benicar due to low BP Encourage low-sodium diet, less than 2000 mg daily. Follow-up in 6 months or sooner if needed.   SVT Echocardiogram within normal limits Will give EP referral For now, she does not have symptoms too often Patient refuses sleep study   Palpitations No Afib on monitor but a lot of artifact Take only 1/2 Toprol pill at night     [place],  DO, [facility]  10/18/2022, 2:51 PM Office: [phone] Pager: [phone]

## 2022-11-01 ENCOUNTER — Other Ambulatory Visit (HOSPITAL_BASED_OUTPATIENT_CLINIC_OR_DEPARTMENT_OTHER): Payer: Self-pay

## 2022-11-01 MED ORDER — PREDNISONE 10 MG PO TABS
ORAL_TABLET | ORAL | 0 refills | Status: DC
  Filled 2022-11-01: qty 18, 7d supply, fill #0

## 2022-11-01 MED ORDER — FLUOCINOLONE ACETONIDE 0.01 % OT OIL
5.0000 [drp] | TOPICAL_OIL | Freq: Two times a day (BID) | OTIC | 3 refills | Status: DC
  Filled 2022-11-01: qty 20, 30d supply, fill #0

## 2022-11-23 ENCOUNTER — Other Ambulatory Visit (HOSPITAL_BASED_OUTPATIENT_CLINIC_OR_DEPARTMENT_OTHER): Payer: Self-pay

## 2022-11-23 ENCOUNTER — Other Ambulatory Visit: Payer: Self-pay | Admitting: Adult Health

## 2022-11-23 ENCOUNTER — Telehealth: Payer: Self-pay

## 2022-11-23 MED ORDER — ROSUVASTATIN CALCIUM 5 MG PO TABS
ORAL_TABLET | ORAL | 3 refills | Status: AC
  Filled 2022-11-23: qty 90, 90d supply, fill #0
  Filled 2023-03-12 (×2): qty 90, 90d supply, fill #1
  Filled 2023-06-15: qty 90, 90d supply, fill #2
  Filled 2023-10-08 (×2): qty 90, 90d supply, fill #3

## 2022-11-23 NOTE — Telephone Encounter (Signed)
Patient is not going to take metoprolol at this time, she is going out of town. She is asking if she can get a new rx for Olmesartan.

## 2022-11-24 ENCOUNTER — Other Ambulatory Visit (HOSPITAL_BASED_OUTPATIENT_CLINIC_OR_DEPARTMENT_OTHER): Payer: Self-pay

## 2022-11-24 MED ORDER — OLMESARTAN MEDOXOMIL 5 MG PO TABS
5.0000 mg | ORAL_TABLET | Freq: Every day | ORAL | 0 refills | Status: DC
  Filled 2022-11-24: qty 30, 30d supply, fill #0

## 2022-12-21 ENCOUNTER — Ambulatory Visit: Payer: Medicare Other | Admitting: Cardiology

## 2022-12-24 ENCOUNTER — Other Ambulatory Visit (HOSPITAL_BASED_OUTPATIENT_CLINIC_OR_DEPARTMENT_OTHER): Payer: Self-pay

## 2022-12-24 MED ORDER — OLMESARTAN MEDOXOMIL 5 MG PO TABS
5.0000 mg | ORAL_TABLET | Freq: Every day | ORAL | 1 refills | Status: DC
  Filled 2022-12-24: qty 90, 90d supply, fill #0
  Filled 2023-03-12 (×2): qty 90, 90d supply, fill #1

## 2023-01-31 ENCOUNTER — Ambulatory Visit: Payer: Medicare Other | Admitting: Interventional Cardiology

## 2023-03-12 ENCOUNTER — Other Ambulatory Visit (HOSPITAL_BASED_OUTPATIENT_CLINIC_OR_DEPARTMENT_OTHER): Payer: Self-pay

## 2023-04-11 ENCOUNTER — Ambulatory Visit: Payer: Medicare Other | Admitting: Cardiology

## 2023-04-18 ENCOUNTER — Ambulatory Visit: Payer: Self-pay | Admitting: Cardiology

## 2023-04-29 ENCOUNTER — Other Ambulatory Visit (HOSPITAL_BASED_OUTPATIENT_CLINIC_OR_DEPARTMENT_OTHER): Payer: Self-pay

## 2023-04-29 MED ORDER — IBUPROFEN 800 MG PO TABS
800.0000 mg | ORAL_TABLET | Freq: Three times a day (TID) | ORAL | 1 refills | Status: DC | PRN
  Filled 2023-04-29: qty 90, 30d supply, fill #0

## 2023-05-10 ENCOUNTER — Telehealth: Payer: Self-pay

## 2023-05-10 NOTE — Telephone Encounter (Signed)
Left pt message to call back to schedule tele pre op appt.

## 2023-05-10 NOTE — Telephone Encounter (Signed)
   Name: Chloe Cobb  DOB: [birth year]  MRN: [id number]  Primary Cardiologist: None   Preoperative team, please contact this patient and set up a phone call appointment for further preoperative risk assessment. Please obtain consent and complete medication review. Thank you for your help.  I confirm that guidance regarding antiplatelet and oral anticoagulation therapy has been completed and, if necessary, noted below. NO request to hold any medications. Not on antiplatelets or anticoagulation.  I also confirmed the patient resides in the state of [place]. As per [facility] Medical Board telemedicine laws, the patient must reside in the state in which the provider is licensed.   Joni Reining, NP 05/10/2023, 1:52 PM [facility]

## 2023-05-10 NOTE — Telephone Encounter (Signed)
   Pre-operative Risk Assessment    Patient Name: Chloe Cobb  DOB: December 14, 1945 MRN: [id number]  DATE OF LAST OFFICE VISIT: 10/18/22 WITH DR. Rozell Searing CUSTOVIC DATE OF NEXT OFFICE VISIT: 08/23/23 WITH DR. MARK SKAINS   Request for Surgical Clearance    Procedure:   RIGHT TOTAL HIP ARTHROPLASTY  Date of Surgery:  Clearance 07/19/23                                 Surgeon:  DR. MATTHEW OLIN Surgeon's Group or Practice Name:  [facility] Phone number:  [phone] ATTN: Rosalva Ferron Fax number:  [phone]   Type of Clearance Requested:   - Medical - NONE INDICATED TO BE HELD   Type of Anesthesia:  Spinal   Additional requests/questions:    Robley Fries   05/10/2023, 12:51 PM

## 2023-05-11 ENCOUNTER — Telehealth: Payer: Self-pay | Admitting: *Deleted

## 2023-05-11 NOTE — Telephone Encounter (Signed)
Pt has been scheduled tele preop appt 07/04/23. Med rec and consent are done.     Patient Consent for Virtual Visit        Chloe Cobb has provided verbal consent on 05/11/2023 for a virtual visit (video or telephone).   CONSENT FOR VIRTUAL VISIT FOR:  Chloe Cobb  By participating in this virtual visit I agree to the following:  I hereby voluntarily request, consent and authorize [facility] and its employed or contracted physicians, physician assistants, nurse practitioners or other licensed health care professionals (the Practitioner), to provide me with telemedicine health care services (the "Services") as deemed necessary by the treating Practitioner. I acknowledge and consent to receive the Services by the Practitioner via telemedicine. I understand that the telemedicine visit will involve communicating with the Practitioner through live audiovisual communication technology and the disclosure of certain medical information by electronic transmission. I acknowledge that I have been given the opportunity to request an in-person assessment or other available alternative prior to the telemedicine visit and am voluntarily participating in the telemedicine visit.  I understand that I have the right to withhold or withdraw my consent to the use of telemedicine in the course of my care at any time, without affecting my right to future care or treatment, and that the Practitioner or I may terminate the telemedicine visit at any time. I understand that I have the right to inspect all information obtained and/or recorded in the course of the telemedicine visit and may receive copies of available information for a reasonable fee.  I understand that some of the potential risks of receiving the Services via telemedicine include:  Delay or interruption in medical evaluation due to technological equipment failure or disruption; Information transmitted may not be sufficient (e.g. poor  resolution of images) to allow for appropriate medical decision making by the Practitioner; and/or  In rare instances, security protocols could fail, causing a breach of personal health information.  Furthermore, I acknowledge that it is my responsibility to provide information about my medical history, conditions and care that is complete and accurate to the best of my ability. I acknowledge that Practitioner's advice, recommendations, and/or decision may be based on factors not within their control, such as incomplete or inaccurate data provided by me or distortions of diagnostic images or specimens that may result from electronic transmissions. I understand that the practice of medicine is not an exact science and that Practitioner makes no warranties or guarantees regarding treatment outcomes. I acknowledge that a copy of this consent can be made available to me via my patient portal [facility] MyChart), or I can request a printed copy by calling the office of [facility].    I understand that my insurance will be billed for this visit.   I have read or had this consent read to me. I understand the contents of this consent, which adequately explains the benefits and risks of the Services being provided via telemedicine.  I have been provided ample opportunity to ask questions regarding this consent and the Services and have had my questions answered to my satisfaction. I give my informed consent for the services to be provided through the use of telemedicine in my medical care

## 2023-05-11 NOTE — Telephone Encounter (Signed)
Pt has been scheduled tele preop appt 07/04/23. Med rec and consent are done.

## 2023-05-11 NOTE — Telephone Encounter (Signed)
Patient is returning phone call.  °

## 2023-05-24 IMAGING — CT CT HIP*R* W/O CM
1 series · 15 of 32 positions shown, 19 images · non-contrast
Comparison: None available

CLINICAL DATA: Anterior right hip pain for 2 months after moving
husband into a wheelchair.

EXAM:
CT OF THE RIGHT HIP WITHOUT CONTRAST
TECHNIQUE: Multidetector CT imaging of the right hip was performed according to
the standard protocol. Multiplanar CT image reconstructions were
also generated.
RADIATION DOSE REDUCTION: This exam was performed according to the
departmental dose-optimization program which includes automated
exposure control, adjustment of the mA and/or kV according to
patient size and/or use of iterative reconstruction technique.

[Series 3: soft tissue pelvis/hip · axial · 0.49mm/px · z∈[+771,+1017]mm · 15 of 91 slices shown, 19 images]
[im 6/91  soft-tissue]
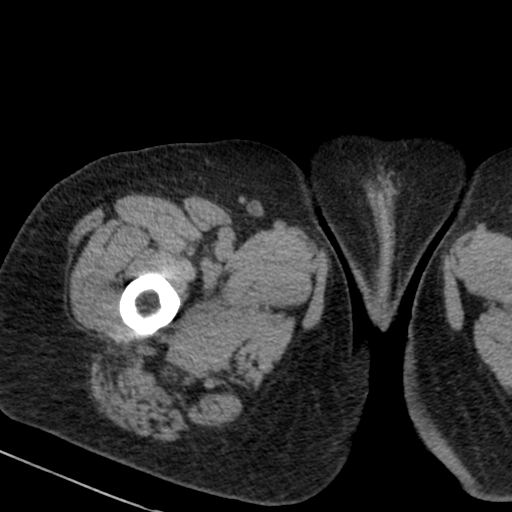
[im 6/91  bone]
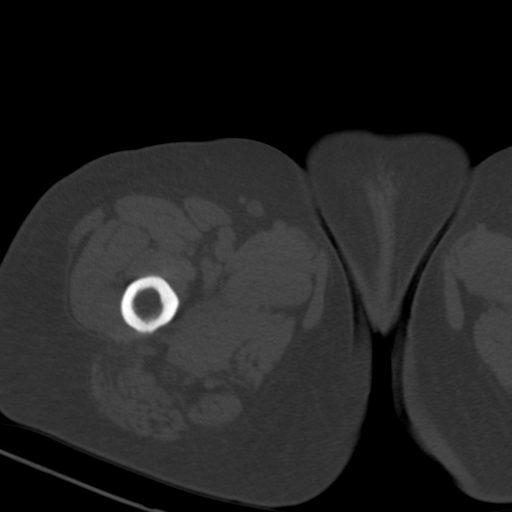
[im 12/91  soft-tissue]
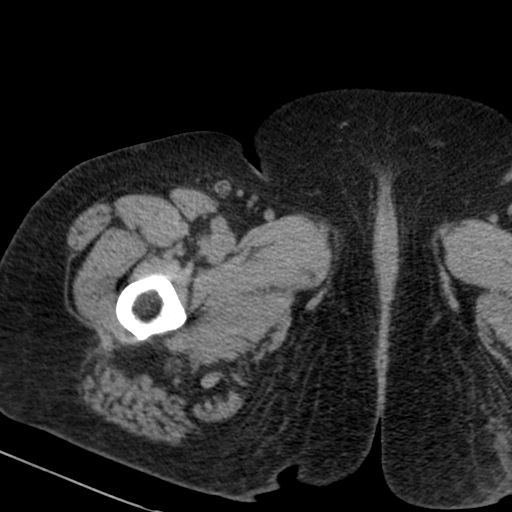
[im 18/91  soft-tissue]
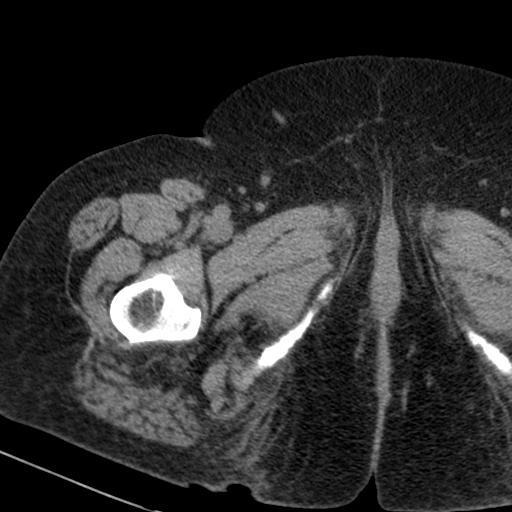
[im 27/91  soft-tissue]
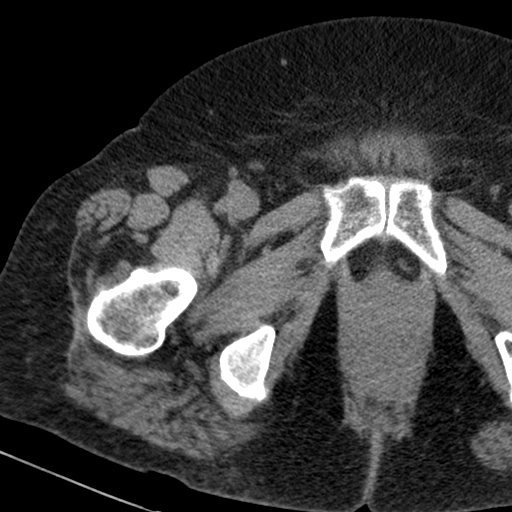
[im 32/91  soft-tissue]
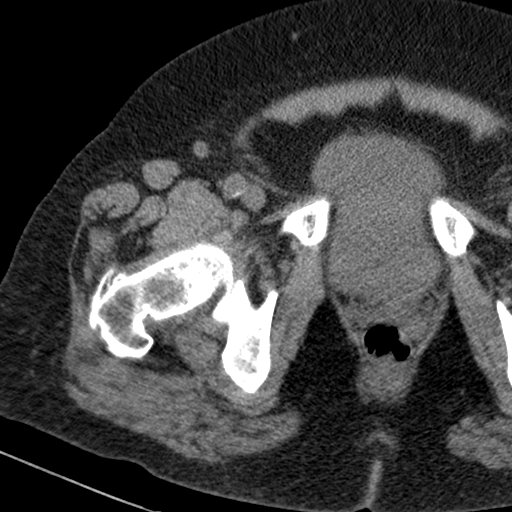
[im 38/91  soft-tissue]
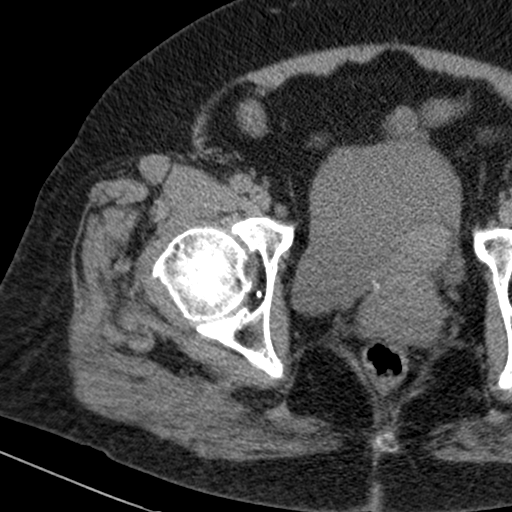
[im 47/91  soft-tissue]
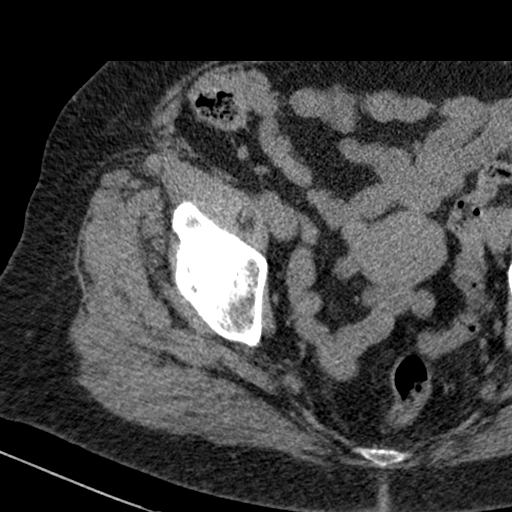
[im 53/91  soft-tissue]
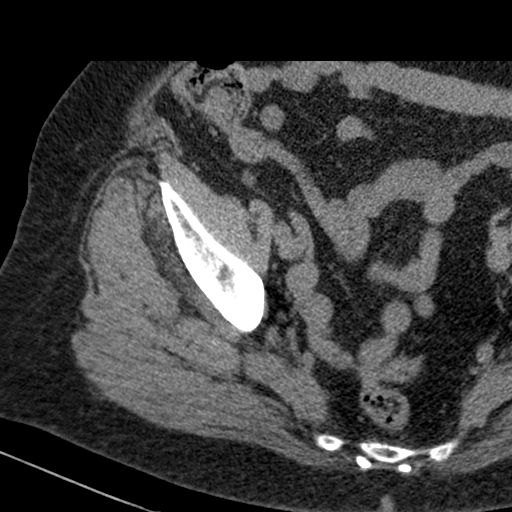
[im 59/91  soft-tissue]
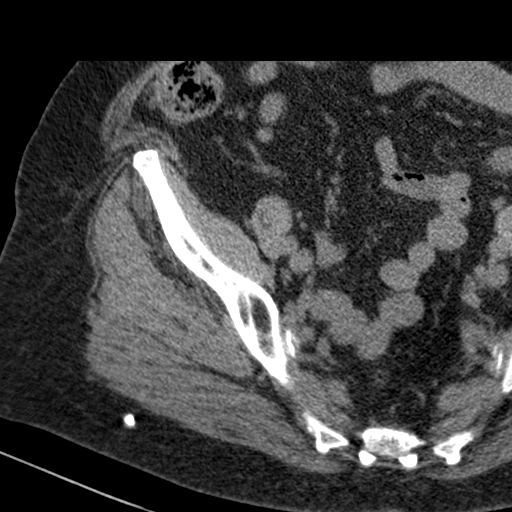
[im 59/91  bone]
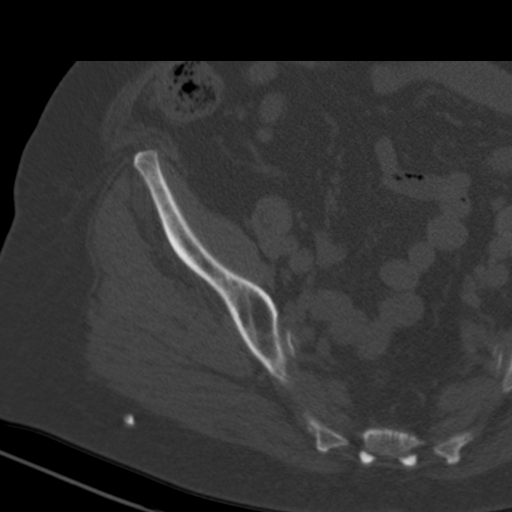
[im 64/91  soft-tissue]
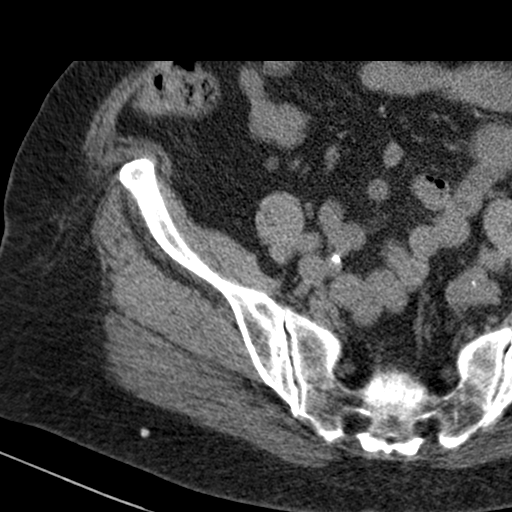
[im 73/91  soft-tissue]
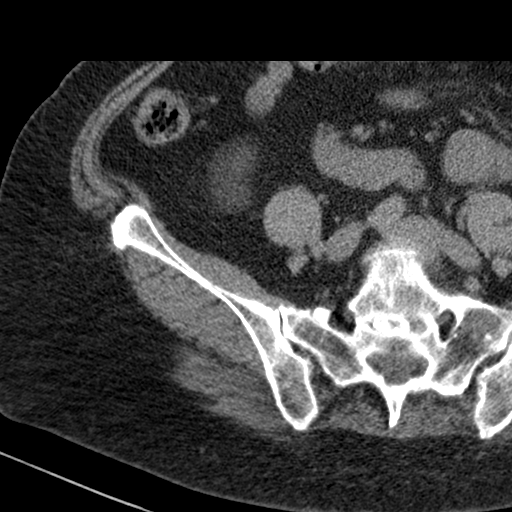
[im 79/91  soft-tissue]
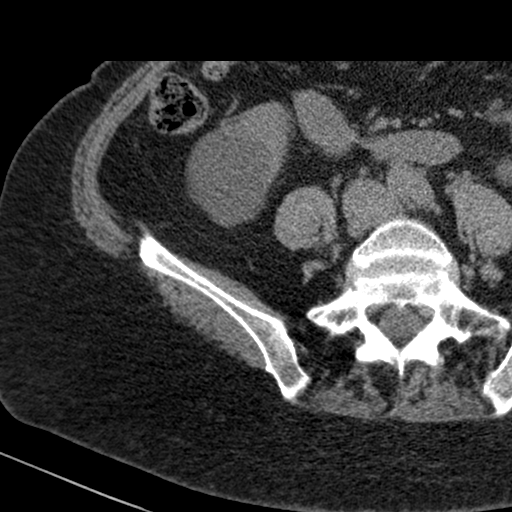
[im 79/91  lung]
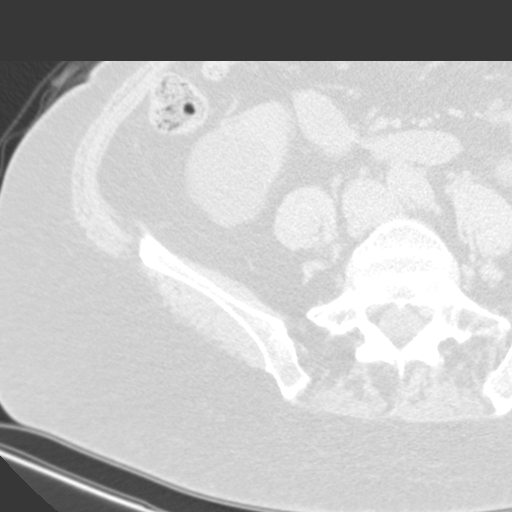
[im 82/91  lung]
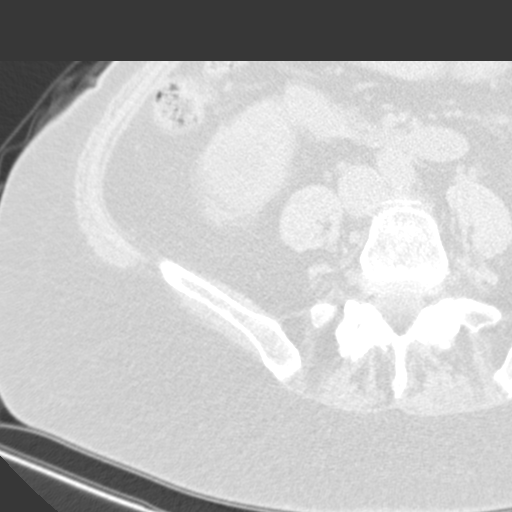
[im 85/91  soft-tissue]
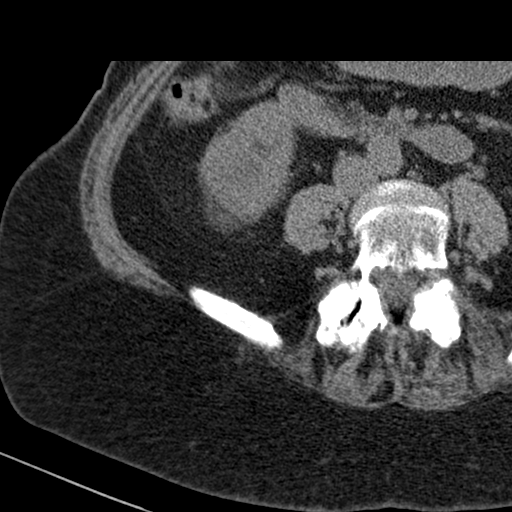
[im 85/91  lung]
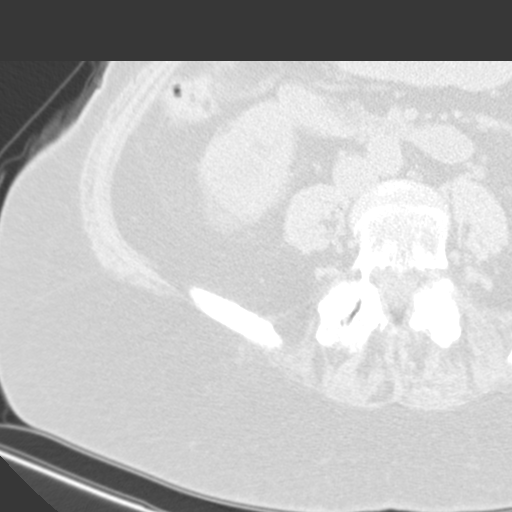
[im 88/91  lung]
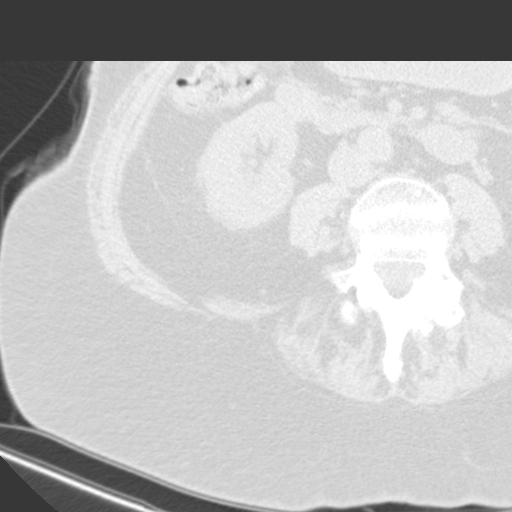

[15 of 32 positions shown; findings below may reference images not displayed]

FINDINGS: Bones/Joint/Cartilage

Moderate superior right femoroacetabular joint space narrowing.
Mild-to-moderate superolateral right acetabular and femoral
head-neck junction degenerative osteophytosis.

Mild joint space narrowing of the pubic symphysis.

Mild bilateral sacroiliac joint subchondral sclerosis and
degenerative vacuum phenomenon.

There is transitional lumbosacral anatomy. The L5 vertebral body is
considered a partially sacralized vertebral body. By this numbering,
there is grade 1 anterolisthesis of L3 on L4, partially visualized.

Severe right-greater-than-left L4-5 facet joint degenerative
changes.

No acute fracture or dislocation.

Ligaments

Suboptimally assessed by CT.

Muscles and Tendons

Unremarkable.

Soft tissues

No right hip joint effusion. Uterus is present. No ascites is seen.
IMPRESSION: :
IMPRESSION: 1. Moderate right femoroacetabular osteoarthritis.
2. No acute fracture.

## 2023-06-15 ENCOUNTER — Other Ambulatory Visit (HOSPITAL_BASED_OUTPATIENT_CLINIC_OR_DEPARTMENT_OTHER): Payer: Self-pay

## 2023-06-15 MED ORDER — OLMESARTAN MEDOXOMIL 5 MG PO TABS
5.0000 mg | ORAL_TABLET | Freq: Every day | ORAL | 0 refills | Status: DC
  Filled 2023-06-15: qty 90, 90d supply, fill #0

## 2023-07-04 ENCOUNTER — Encounter: Payer: Self-pay | Admitting: Nurse Practitioner

## 2023-07-04 ENCOUNTER — Ambulatory Visit: Payer: Medicare Other | Attending: Nurse Practitioner | Admitting: Nurse Practitioner

## 2023-07-04 DIAGNOSIS — Z01810 Encounter for preprocedural cardiovascular examination: Secondary | ICD-10-CM | POA: Diagnosis not present

## 2023-07-04 NOTE — Progress Notes (Signed)
 Virtual Visit via Telephone Note   Because of Wandy Bossler Hoelting's co-morbid illnesses, she is at least at moderate risk for complications without adequate follow up.  This format is felt to be most appropriate for this patient at this time.  The patient did not have access to video technology/had technical difficulties with video requiring transitioning to audio format only (telephone).  All issues noted in this document were discussed and addressed.  No physical exam could be performed with this format.  Please refer to the patient's chart for her consent to telehealth for [facility].  Evaluation Performed:  Preoperative cardiovascular risk assessment _____________   Date:  07/04/2023   Patient ID:  Lezly, Rumpf [birth year], MRN [id number] Patient Location:  Home Provider location:   Office  Primary Care Provider:  Rolinda Millman, MD Primary Cardiologist:  Oneil Parchment, MD  Chief Complaint / Patient Profile   78 y.o. y/o female with a h/o hypertension, hyperlipidemia, palpitations, SVT who is pending right total hip arthroplasty with Dr. Ernie on 07/19/23 and presents today for telephonic preoperative cardiovascular risk assessment.  History of Present Illness    Shada Nienaber is a 78 y.o. female who presents via audio/video conferencing for a telehealth visit today.  Pt was last seen in cardiology clinic on 10/18/2022 by Annalee Casa, DO.  At that time Sianni Cloninger was doing well.  The patient is now pending procedure as outlined above. Since her last visit, she denies chest pain, shortness of breath, lower extremity edema, fatigue, palpitations, melena, hematuria, hemoptysis, diaphoresis, weakness, presyncope, syncope, orthopnea, and PND. She is riding a stationary bike and is active at home and is able to achieve > 4 METS activity without concerning cardiac symptoms.   Past Medical History    Past Medical History:  Diagnosis Date   Abnormal mammogram     Allergic reaction to substance    Allergy    Environmental allergies    Hyperkalemia    Hyperlipidemia    Hypertension    Intermittent palpitations    Mass of right breast on mammogram    Palpitation    No past surgical history on file.  Allergies  Allergies  Allergen Reactions   Beeswax     Other Reaction(s): rash, swelling   Hydrogen Peroxide     Other Reaction(s): facial swelling   Other Swelling    DUST    Home Medications    Prior to Admission medications   Medication Sig Start Date End Date Taking? Authorizing Provider  ammonium lactate  (LAC-HYDRIN ) 12 % lotion Apply topically. 03/11/21     cholecalciferol (VITAMIN D3) 25 MCG (1000 UNIT) tablet Take 1,000 Units by mouth daily.    [provider]  Emollient (CERAVE) CREA Apply to face and body 1-2 times daily 03/11/21     Fluocinolone  Acetonide 0.01 % OIL Placce 5 drops into affected ear 2 (two) times daily for 30 days. 11/01/22     hydrocortisone  2.5 % cream Cleanse affected areas with gentle nonperfumed cleanser, pat dry or use cool dryer, then apply peasize amount twice a day for two weeks then daily as needed with kenoconazole. 01/14/21     ibuprofen  (ADVIL ) 800 MG tablet Take 1 tablet (800 mg total) by mouth 3 (three) times daily as needed. 04/29/23     influenza vaccine adjuvanted (FLUAD  QUADRIVALENT) 0.5 ML injection Inject into the muscle. 03/30/22   Luiz Channel, MD  ketoconazole  (NIZORAL ) 2 % cream Cleanse with gentle cleanser and  washcloth and dry completely then apply to afffected area peasize amount twice a day under breasts and groin for 2 weeks and with flares 01/14/21     metoprolol  succinate (TOPROL  XL) 25 MG 24 hr tablet Take 1 tablet (25 mg total) by mouth at bedtime. Patient not taking: Reported on 05/11/2023 10/18/22   Custovic, Sabina, DO  miconazole  (ZEASORB-AF) 2 % powder Apply daily to affected area. 01/14/21     olmesartan  (BENICAR ) 5 MG tablet Take 1 tablet (5 mg total) by mouth daily. 06/15/23      predniSONE  (DELTASONE ) 10 MG tablet Take 10 mg by mouth daily as needed. Patient not taking: Reported on 05/11/2023    [provider]  predniSONE  (DELTASONE ) 10 MG tablet Take 4 tablets by mouth daily for 3 days, 2 tabs for 2 days, 1 tab for 2 days 11/01/22     rosuvastatin  (CRESTOR ) 5 MG tablet Take 1 tablet by mouth everyday at bedtime 11/23/22   Darlean Maus, NP  Sodium Fluoride  1.1 % PSTE Apply pea size amount onto brush and apply to all surfaces of teeth for 2 minutes per day. 11/09/21     tacrolimus  (PROTOPIC ) 0.1 % ointment Use 1 application Externally Once a day 30 days 07/14/21     traMADol  (ULTRAM ) 50 MG tablet Take 1 tablet (50 mg total) by mouth every 6 (six) hours as needed for pain 07/22/22     triamcinolone  cream (KENALOG ) 0.1 % 1 application topically BID 14 days 07/14/21     vitamin B-12 (CYANOCOBALAMIN) 100 MCG tablet Take 100 mcg by mouth daily.    [provider]  vitamin E 200 UNIT capsule Take 200 Units by mouth daily. Patient not taking: Reported on 05/11/2023    [provider]    Physical Exam    Vital Signs:  Ronal Jenkins Louder does not have vital signs available for review today.  Given telephonic nature of communication, physical exam is limited. AAOx3. NAD. Normal affect.  Speech and respirations are unlabored.  Accessory Clinical Findings    None  Assessment & Plan    1.  Preoperative Cardiovascular Risk Assessment: According to the Revised Cardiac Risk Index (RCRI), her Perioperative Risk of Major Cardiac Event is (%): 0.9. Her Functional Capacity in METs is: 6.79 according to the [facility] Activity Status Index (DASI). The patient is doing well from a cardiac perspective. Therefore, based on ACC/AHA guidelines, the patient would be at acceptable risk for the planned procedure without further cardiovascular testing.   The patient was advised that if she develops new symptoms prior to surgery to contact our office to arrange for a  follow-up visit, and she verbalized understanding.  No request to hold cardiac medications.   A copy of this note will be routed to requesting surgeon.  Time:   Today, I have spent 10 minutes with the patient with telehealth technology discussing medical history, symptoms, and management plan.    Rosaline EMERSON Bane, NP-C  07/04/2023, 2:21 PM [address] Office [phone] Fax [phone]

## 2023-07-11 NOTE — Progress Notes (Addendum)
 COVID Vaccine received:  []  No [x]  Yes Date of any COVID positive Test in last 90 days: no PCP - Vernell Fort MD Cardiologist - Oneil Parchment MD  Cardiac clearance 07/04/23 Lilly Swinyer NP-C  Chest x-ray -  EKG -  08/27/22 Epic Stress Test -  ECHO - 09/28/22 Epic Cardiac Cath -   Bowel Prep - [x]  No  []   Yes ______  Pacemaker / ICD device [x]  No []  Yes   Spinal Cord Stimulator:[x]  No []  Yes       History of Sleep Apnea? [x]  No []  Yes   CPAP used?- [x]  No []  Yes    Does the patient monitor blood sugar?          [x]  No []  Yes  []  N/A  Patient has: [x]  NO Hx DM   []  Pre-DM                 []  DM1  []   DM2 Does patient have a [employer] or Dexacom? []  No []  Yes   Fasting Blood Sugar Ranges-  Checks Blood Sugar _____ times a day  GLP1 agonist / usual dose - no GLP1 instructions:  SGLT-2 inhibitors / usual dose - no SGLT-2 instructions:  no Blood Thinner / Instructions:no Aspirin  Instructions:no  Comments:   Activity level: Patient is able to climb a flight of stairs without difficulty; [x]  No CP  [x]  No SOB,___   Patient can  do all  ADL's without assistance  Patient denies shortness of breath, fever, cough and chest pain at PAT appointment.  Patient verbalized understanding and agreement to the Pre-Surgical Instructions that were given to them at this PAT appointment. Patient was also educated of the need to review these PAT instructions again prior to his/her surgery.I reviewed the appropriate phone numbers to call if they have any and questions or concerns.

## 2023-07-11 NOTE — Patient Instructions (Addendum)
 SURGICAL WAITING ROOM VISITATION  Patients having surgery or a procedure may have no more than 2 support people in the waiting area - these visitors may rotate.    Children under the age of 44 must have an adult with them who is not the patient.  Due to an increase in RSV and influenza rates and associated hospitalizations, children ages 42 and under may not visit patients in [facility] hospitals.  If the patient needs to stay at the hospital during part of their recovery, the visitor guidelines for inpatient rooms apply. Pre-op nurse will coordinate an appropriate time for 1 support person to accompany patient in pre-op.  This support person may not rotate.    Please refer to the [facility] website for the visitor guidelines for Inpatients (after your surgery is over and you are in a regular room).       Your procedure is scheduled on: 07/19/23   Report to [facility] Main Entrance    Report to admitting at 6:10 AM   Call this number if you have problems the morning of surgery [phone]   Do not eat food :After Midnight.   After Midnight you may have the following liquids until 5:40 AM DAY OF SURGERY  Water  Non-Citrus Juices (without pulp, NO RED-Apple, White grape, White cranberry) Black Coffee (NO MILK/CREAM OR CREAMERS, sugar ok)  Clear Tea (NO MILK/CREAM OR CREAMERS, sugar ok) regular and decaf                             Plain Jell-O (NO RED)                                           Fruit ices (not with fruit pulp, NO RED)                                     Popsicles (NO RED)                                                               Sports drinks like Gatorade (NO RED)                The day of surgery:  Drink ONE (1) Pre-Surgery Clear Ensure at 5:40 AM the morning of surgery. Drink in one sitting. Do not sip.  This drink was given to you during your hospital  pre-op appointment visit. Nothing else to drink after completing the  Pre-Surgery Clear  Ensure     Oral Hygiene is also important to reduce your risk of infection.                                    Remember - BRUSH YOUR TEETH THE MORNING OF SURGERY WITH YOUR REGULAR TOOTHPASTE   Stop all vitamins and herbal supplements 7 days before surgery.   Take these medicines the morning of surgery with A SIP OF WATER : Rosuvastatin   You may not have any metal on your body including hair pins, jewelry, and body piercing             Do not wear make-up, lotions, powders, perfumes/cologne, or deodorant  Do not wear nail polish including gel and S&S, artificial/acrylic nails, or any other type of covering on natural nails including finger and toenails. If you have artificial nails, gel coating, etc. that needs to be removed by a nail salon please have this removed prior to surgery or surgery may need to be canceled/ delayed if the surgeon/ anesthesia feels like they are unable to be safely monitored.   Do not shave  48 hours prior to surgery.    Do not bring valuables to the hospital. [facility] IS NOT             RESPONSIBLE   FOR VALUABLES.   Contacts, glasses, dentures or bridgework may not be worn into surgery.   Bring small overnight bag day of surgery.   DO NOT BRING YOUR HOME MEDICATIONS TO THE HOSPITAL. PHARMACY WILL DISPENSE MEDICATIONS LISTED ON YOUR MEDICATION LIST TO YOU DURING YOUR ADMISSION IN THE HOSPITAL!    Patients discharged on the day of surgery will not be allowed to drive home.  Someone NEEDS to stay with you for the first 24 hours after anesthesia.   Special Instructions: Bring a copy of your healthcare power of attorney and living will documents the day of surgery if you haven't scanned them before.              Please read over the following fact sheets you were given: IF YOU HAVE QUESTIONS ABOUT YOUR PRE-OP INSTRUCTIONS PLEASE CALL [phone] Chloe Cobb   If you received a COVID test during your pre-op visit  it is requested that you wear a mask  when out in public, stay away from anyone that may not be feeling well and notify your surgeon if you develop symptoms. If you test positive for Covid or have been in contact with anyone that has tested positive in the last 10 days please notify you surgeon.      Pre-operative 5 CHG Bath Instructions   You can play a key role in reducing the risk of infection after surgery. Your skin needs to be as free of germs as possible. You can reduce the number of germs on your skin by washing with CHG (chlorhexidine  gluconate) soap before surgery. CHG is an antiseptic soap that kills germs and continues to kill germs even after washing.   DO NOT use if you have an allergy to chlorhexidine /CHG or antibacterial soaps. If your skin becomes reddened or irritated, stop using the CHG and notify one of our RNs at [phone].   Please shower with the CHG soap starting 4 days before surgery using the following schedule:     Please keep in mind the following:  DO NOT shave, including legs and underarms, starting the day of your first shower.   You may shave your face at any point before/day of surgery.  Place clean sheets on your bed the day you start using CHG soap. Use a clean washcloth (not used since being washed) for each shower. DO NOT sleep with pets once you start using the CHG.   CHG Shower Instructions:  If you choose to wash your hair and private area, wash first with your normal shampoo/soap.  After you use shampoo/soap, rinse your hair and body thoroughly to remove shampoo/soap residue.  Turn the  water  OFF and apply about 3 tablespoons (45 ml) of CHG soap to a CLEAN washcloth.  Apply CHG soap ONLY FROM YOUR NECK DOWN TO YOUR TOES (washing for 3-5 minutes)  DO NOT use CHG soap on face, private areas, open wounds, or sores.  Pay special attention to the area where your surgery is being performed.  If you are having back surgery, having someone wash your back for you may be helpful. Wait 2  minutes after CHG soap is applied, then you may rinse off the CHG soap.  Pat dry with a clean towel  Put on clean clothes/pajamas   If you choose to wear lotion, please use ONLY the CHG-compatible lotions on the back of this paper.     Additional instructions for the day of surgery: DO NOT APPLY any lotions, deodorants, cologne, or perfumes.   Put on clean/comfortable clothes.  Brush your teeth.  Ask your nurse before applying any prescription medications to the skin.      CHG Compatible Lotions   Aveeno Moisturizing lotion  Cetaphil Moisturizing Cream  Cetaphil Moisturizing Lotion  Clairol Herbal Essence Moisturizing Lotion, Dry Skin  Clairol Herbal Essence Moisturizing Lotion, Extra Dry Skin  Clairol Herbal Essence Moisturizing Lotion, Normal Skin  Curel Age Defying Therapeutic Moisturizing Lotion with Alpha Hydroxy  Curel Extreme Care Body Lotion  Curel Soothing Hands Moisturizing Hand Lotion  Curel Therapeutic Moisturizing Cream, Fragrance-Free  Curel Therapeutic Moisturizing Lotion, Fragrance-Free  Curel Therapeutic Moisturizing Lotion, Original Formula  Eucerin Daily Replenishing Lotion  Eucerin Dry Skin Therapy Plus Alpha Hydroxy Crme  Eucerin Dry Skin Therapy Plus Alpha Hydroxy Lotion  Eucerin Original Crme  Eucerin Original Lotion  Eucerin Plus Crme Eucerin Plus Lotion  Eucerin TriLipid Replenishing Lotion  Keri Anti-Bacterial Hand Lotion  Keri Deep Conditioning Original Lotion Dry Skin Formula Softly Scented  Keri Deep Conditioning Original Lotion, Fragrance Free Sensitive Skin Formula  Keri Lotion Fast Absorbing Fragrance Free Sensitive Skin Formula  Keri Lotion Fast Absorbing Softly Scented Dry Skin Formula  Keri Original Lotion  Keri Skin Renewal Lotion Keri Silky Smooth Lotion  Keri Silky Smooth Sensitive Skin Lotion  Nivea Body Creamy Conditioning Oil  Nivea Body Extra Enriched Lotion  Nivea Body Original Lotion  Nivea Body Sheer Moisturizing Lotion  Nivea Crme  Nivea Skin Firming Lotion  NutraDerm 30 Skin Lotion  NutraDerm Skin Lotion  NutraDerm Therapeutic Skin Cream  NutraDerm Therapeutic Skin Lotion  ProShield Protective Hand Cream  Incentive Spirometer (Watch this video at home: [email or web address])  An incentive spirometer is a tool that can help keep your lungs clear and active. This tool measures how well you are filling your lungs with each breath. Taking long deep breaths may help reverse or decrease the chance of developing breathing (pulmonary) problems (especially infection) following: A long period of time when you are unable to move or be active. BEFORE THE PROCEDURE  If the spirometer includes an indicator to show your best effort, your nurse or respiratory therapist will set it to a desired goal. If possible, sit up straight or lean slightly forward. Try not to slouch. Hold the incentive spirometer in an upright position. INSTRUCTIONS FOR USE  Sit on the edge of your bed if possible, or sit up as far as you can in bed or on a chair. Hold the incentive spirometer in an upright position. Breathe out normally. Place the mouthpiece in your mouth and seal your lips tightly around it. Breathe in slowly and as deeply as possible, raising the  piston or the ball toward the top of the column. Hold your breath for 3-5 seconds or for as long as possible. Allow the piston or ball to fall to the bottom of the column. Remove the mouthpiece from your mouth and breathe out normally. Rest for a few seconds and repeat Steps 1 through 7 at least 10 times every 1-2 hours when you are awake. Take your time and take a few normal breaths between deep breaths. The spirometer may include an indicator to show your best effort. Use the indicator as a goal to work toward during each repetition. After each set of 10 deep breaths, practice coughing to be sure your lungs are clear. If you have an incision (the cut made at the  time of surgery), support your incision when coughing by placing a pillow or rolled up towels firmly against it. Once you are able to get out of bed, walk around indoors and cough well. You may stop using the incentive spirometer when instructed by your caregiver.  RISKS AND COMPLICATIONS Take your time so you do not get dizzy or light-headed. If you are in pain, you may need to take or ask for pain medication before doing incentive spirometry. It is harder to take a deep breath if you are having pain. AFTER USE Rest and breathe slowly and easily. It can be helpful to keep track of a log of your progress. Your caregiver can provide you with a simple table to help with this. If you are using the spirometer at home, follow these instructions: SEEK MEDICAL CARE IF:  You are having difficultly using the spirometer. You have trouble using the spirometer as often as instructed. Your pain medication is not giving enough relief while using the spirometer. You develop fever of 100.5 F (38.1 C) or higher. SEEK IMMEDIATE MEDICAL CARE IF:  You cough up bloody sputum that had not been present before. You develop fever of 102 F (38.9 C) or greater. You develop worsening pain at or near the incision site. MAKE SURE YOU:  Understand these instructions. Will watch your condition. Will get help right away if you are not doing well or get worse. Document Released: 10/25/2006 Document Revised: 09/06/2011 Document Reviewed: 12/26/2006   WHAT IS A BLOOD TRANSFUSION? Blood Transfusion Information  A transfusion is the replacement of blood or some of its parts. Blood is made up of multiple cells which provide different functions. Red blood cells carry oxygen and are used for blood loss replacement. White blood cells fight against infection. Platelets control bleeding. Plasma helps clot blood. Other blood products are available for specialized needs, such as hemophilia or other clotting disorders. BEFORE  THE TRANSFUSION  Who gives blood for transfusions?  Healthy volunteers who are fully evaluated to make sure their blood is safe. This is blood bank blood. Transfusion therapy is the safest it has ever been in the practice of medicine. Before blood is taken from a donor, a complete history is taken to make sure that person has no history of diseases nor engages in risky social behavior (examples are intravenous drug use or sexual activity with multiple partners). The donor's travel history is screened to minimize risk of transmitting infections, such as malaria. The donated blood is tested for signs of infectious diseases, such as HIV and hepatitis. The blood is then tested to be sure it is compatible with you in order to minimize the chance of a transfusion reaction. If you or a relative donates blood, this is  often done in anticipation of surgery and is not appropriate for emergency situations. It takes many days to process the donated blood. RISKS AND COMPLICATIONS Although transfusion therapy is very safe and saves many lives, the main dangers of transfusion include:  Getting an infectious disease. Developing a transfusion reaction. This is an allergic reaction to something in the blood you were given. Every precaution is taken to prevent this. The decision to have a blood transfusion has been considered carefully by your caregiver before blood is given. Blood is not given unless the benefits outweigh the risks. AFTER THE TRANSFUSION Right after receiving a blood transfusion, you will usually feel much better and more energetic. This is especially true if your red blood cells have gotten low (anemic). The transfusion raises the level of the red blood cells which carry oxygen, and this usually causes an energy increase. The nurse administering the transfusion will monitor you carefully for complications. HOME CARE INSTRUCTIONS  No special instructions are needed after a transfusion. You may find your  energy is better. Speak with your caregiver about any limitations on activity for underlying diseases you may have. SEEK MEDICAL CARE IF:  Your condition is not improving after your transfusion. You develop redness or irritation at the intravenous (IV) site. SEEK IMMEDIATE MEDICAL CARE IF:  Any of the following symptoms occur over the next 12 hours: Shaking chills. You have a temperature by mouth above 102 F (38.9 C), not controlled by medicine. Chest, back, or muscle pain. People around you feel you are not acting correctly or are confused. Shortness of breath or difficulty breathing. Dizziness and fainting. You get a rash or develop hives. You have a decrease in urine output. Your urine turns a dark color or changes to pink, red, or brown. Any of the following symptoms occur over the next 10 days: You have a temperature by mouth above 102 F (38.9 C), not controlled by medicine. Shortness of breath. Weakness after normal activity. The white part of the eye turns yellow (jaundice). You have a decrease in the amount of urine or are urinating less often. Your urine turns a dark color or changes to pink, red, or brown. Document Released: 06/11/2000 Document Revised: 09/06/2011 Document Reviewed: 01/29/2008 [facility] Patient Information 2014 [place], [place].

## 2023-07-12 ENCOUNTER — Other Ambulatory Visit: Payer: Self-pay

## 2023-07-12 ENCOUNTER — Encounter (HOSPITAL_COMMUNITY): Payer: Self-pay | Admitting: *Deleted

## 2023-07-12 ENCOUNTER — Encounter (HOSPITAL_COMMUNITY)
Admission: RE | Admit: 2023-07-12 | Discharge: 2023-07-12 | Disposition: A | Discharge status: Home / Self Care | Payer: Medicare Other | Source: Ambulatory Visit | Attending: Orthopedic Surgery | Admitting: Orthopedic Surgery

## 2023-07-12 VITALS — BP 150/77 | HR 65 | Temp 98.1°F | Resp 16 | Ht 65.0 in | Wt 164.0 lb

## 2023-07-12 DIAGNOSIS — M1611 Unilateral primary osteoarthritis, right hip: Secondary | ICD-10-CM | POA: Insufficient documentation

## 2023-07-12 DIAGNOSIS — I10 Essential (primary) hypertension: Secondary | ICD-10-CM | POA: Diagnosis not present

## 2023-07-12 DIAGNOSIS — Z01818 Encounter for other preprocedural examination: Secondary | ICD-10-CM

## 2023-07-12 DIAGNOSIS — Z01812 Encounter for preprocedural laboratory examination: Secondary | ICD-10-CM | POA: Insufficient documentation

## 2023-07-12 HISTORY — DX: Unspecified osteoarthritis, unspecified site: M19.90

## 2023-07-12 LAB — CBC
HCT: 42.4 % (ref 36.0–46.0)
Hemoglobin: 13.8 g/dL (ref 12.0–15.0)
MCH: 30.5 pg (ref 26.0–34.0)
MCHC: 32.5 g/dL (ref 30.0–36.0)
MCV: 93.8 fL (ref 80.0–100.0)
Platelets: 198 10*3/uL (ref 150–400)
RBC: 4.52 MIL/uL (ref 3.87–5.11)
RDW: 11.9 % (ref 11.5–15.5)
WBC: 5.4 10*3/uL (ref 4.0–10.5)
nRBC: 0 % (ref 0.0–0.2)

## 2023-07-12 LAB — BASIC METABOLIC PANEL
Anion gap: 7 (ref 5–15)
BUN: 17 mg/dL (ref 8–23)
CO2: 25 mmol/L (ref 22–32)
Calcium: 9.5 mg/dL (ref 8.9–10.3)
Chloride: 107 mmol/L (ref 98–111)
Creatinine, Ser: 0.85 mg/dL (ref 0.44–1.00)
GFR, Estimated: >60 mL/min (ref >60)
Glucose, Bld: 97 mg/dL (ref 70–99)
Potassium: 5.2 mmol/L — ABNORMAL HIGH (ref 3.5–5.1)
Sodium: 139 mmol/L (ref 135–145)

## 2023-07-12 LAB — SURGICAL PCR SCREEN
MRSA, PCR: NEGATIVE
Staphylococcus aureus: NEGATIVE

## 2023-07-18 NOTE — H&P (Signed)
TOTAL HIP ADMISSION H&P  Patient is admitted for right total hip arthroplasty.  Therapy Plans: [facility] Disposition: Home to [facility] Planned DVT Prophylaxis: aspirin 81mg  BID DME needed: none PCP: Dr. Tracie Harrier Cardio: Dr. Anne Fu - clearance received TXA: IV Allergies: NKDA Anesthesia Concerns: none BMI: 28.1 Last HgbA1c: Not diabetic   Other: - Lives completely alone at [facility] - No hx of VTE or cancer - oxycodone, robaxin, tylenol, celebrex - golfer  Subjective:  Chief Complaint: right hip pain  HPI: Horton Marshall, 78 y.o. female, has a history of pain and functional disability in the right hip(s) due to arthritis and patient has failed non-surgical conservative treatments for greater than 12 weeks to include NSAID's and/or analgesics and activity modification.  Onset of symptoms was gradual starting 2 years ago with gradually worsening course since that time.The patient noted no past surgery on the right hip(s).  Patient currently rates pain in the right hip at 8 out of 10 with activity. Patient has worsening of pain with activity and weight bearing and pain that interfers with activities of daily living. Patient has evidence of joint space narrowing by imaging studies. This condition presents safety issues increasing the risk of falls.  There is no current active infection.  There are no active problems to display for this patient.  Past Medical History:  Diagnosis Date   Abnormal mammogram    Allergic reaction to substance    Allergy    Arthritis    Environmental allergies    Hyperkalemia    Hyperlipidemia    Hypertension    Intermittent palpitations    Mass of right breast on mammogram    Palpitation     Past Surgical History:  Procedure Laterality Date   WISDOM TOOTH EXTRACTION      No current facility-administered medications for this encounter.   Current Outpatient Medications  Medication Sig Dispense Refill Last Dose/Taking    alclomethasone (ACLOVATE) 0.05 % ointment Apply 1 Application topically 2 (two) times daily as needed (irritation).   Taking As Needed   BIOTIN PO Take 1 capsule by mouth daily.   Taking   Cyanocobalamin (B-12 PO) Take 1 capsule by mouth daily.   Taking   hydrocortisone cream 1 % Apply 1 Application topically 2 (two) times daily as needed for itching.   Taking As Needed   ibuprofen (ADVIL) 800 MG tablet Take 1 tablet (800 mg total) by mouth 3 (three) times daily as needed. 90 tablet 1 Taking As Needed   miconazole (ZEASORB-AF) 2 % powder Apply daily to affected area. (Patient taking differently: Apply 1 Application topically daily as needed (dryness/yeast).) 71 g 6 Taking Differently   nystatin ointment (MYCOSTATIN) Apply 1 Application topically 2 (two) times daily as needed (irritation).   Taking As Needed   olmesartan (BENICAR) 5 MG tablet Take 1 tablet (5 mg total) by mouth daily. 90 tablet 0 Taking   predniSONE (DELTASONE) 10 MG tablet Take 40 mg by mouth daily as needed (allergies around eyes / swollen eyes). Take for 3 days in a row   Taking As Needed   rosuvastatin (CRESTOR) 5 MG tablet Take 1 tablet by mouth everyday at bedtime 90 tablet 3 Taking   Sodium Fluoride 1.1 % PSTE Apply pea size amount onto brush and apply to all surfaces of teeth for 2 minutes per day. (Patient taking differently: Place 1 Application onto teeth once a week.) 100 mL 4 Taking Differently   tacrolimus (PROTOPIC) 0.1 % ointment Use 1  application Externally Once a day 30 days 30 g 1 Taking   triamcinolone cream (KENALOG) 0.1 % 1 application topically BID 14 days (Patient taking differently: Apply 1 Application topically 2 (two) times daily as needed (irritation).) 30 g 1 Taking Differently   VITAMIN D PO Take 1 capsule by mouth daily.   Taking   ammonium lactate (LAC-HYDRIN) 12 % lotion Apply topically. (Patient not taking: Reported on 07/07/2023) 226 g 5 Not Taking   Emollient (CERAVE) CREA Apply to face and body 1-2  times daily (Patient not taking: Reported on 07/07/2023) 454 g 5 Not Taking   Fluocinolone Acetonide 0.01 % OIL Placce 5 drops into affected ear 2 (two) times daily for 30 days. (Patient not taking: Reported on 07/07/2023) 20 mL 3 Not Taking   hydrocortisone 2.5 % cream Cleanse affected areas with gentle nonperfumed cleanser, pat dry or use cool dryer, then apply peasize amount twice a day for two weeks then daily as needed with kenoconazole. (Patient not taking: Reported on 07/07/2023) 30 g 1 Not Taking   ketoconazole (NIZORAL) 2 % cream Cleanse with gentle cleanser and washcloth and dry completely then apply to afffected area peasize amount twice a day under breasts and groin for 2 weeks and with flares (Patient not taking: Reported on 07/07/2023) 30 g 2 Not Taking   traMADol (ULTRAM) 50 MG tablet Take 1 tablet (50 mg total) by mouth every 6 (six) hours as needed for pain (Patient not taking: Reported on 07/07/2023) 20 tablet 0 Not Taking   Allergies  Allergen Reactions   Hydrogen Peroxide Swelling     facial swelling   Other Swelling    DUST   Beeswax Swelling and Rash    Social History   Tobacco Use   Smoking status: Former    Current packs/day: 0.00    Types: Cigarettes    Quit date: 1986    Years since quitting: 39.0   Smokeless tobacco: Not on file  Substance Use Topics   Alcohol use: Yes    Alcohol/week: 1.0 standard drink of alcohol    Types: 1 Shots of liquor per week    Comment: a day    Family History  Problem Relation Age of Onset   Heart attack Mother    Parkinson's disease Mother    Heart disease Father    Coronary artery disease Father    Heart disease Sister      Review of Systems  Constitutional:  Negative for chills and fever.  Respiratory:  Negative for cough and shortness of breath.   Cardiovascular:  Negative for chest pain.  Gastrointestinal:  Negative for nausea and vomiting.  Musculoskeletal:  Positive for arthralgias.     Objective:  Physical  Exam Well nourished and well developed. General: Alert and oriented x3, cooperative and pleasant, no acute distress. Head: normocephalic, atraumatic, neck supple. Eyes: EOMI.  Musculoskeletal: Right hip exam: She does have painful and limited range of motion of the right hip with hip flexion internal rotation over 5 degrees with pelvic tilting, external rotation over 20 degrees Active hip flexion with normal strength without significant external rotation contracture Left hip exam: More fluid range of motion without reproducible groin pain is noted She is neurovascular intact distally bilaterally   Calves soft and nontender. Motor function intact in LE. Strength 5/5 LE bilaterally. Neuro: Distal pulses 2+. Sensation to light touch intact in LE.  Vital signs in last 24 hours:    Labs:   Estimated body mass index  is 27.29 kg/m as calculated from the following:   Height as of 07/12/23: 5\' 5"  (1.651 m).   Weight as of 07/12/23: 74.4 kg.   Imaging Review Plain radiographs demonstrate severe degenerative joint disease of the right hip(s). The bone quality appears to be adequate for age and reported activity level.      Assessment/Plan:  End stage arthritis, right hip(s)  The patient history, physical examination, clinical judgement of the provider and imaging studies are consistent with end stage degenerative joint disease of the right hip(s) and total hip arthroplasty is deemed medically necessary. The treatment options including medical management, injection therapy, arthroscopy and arthroplasty were discussed at length. The risks and benefits of total hip arthroplasty were presented and reviewed. The risks due to aseptic loosening, infection, stiffness, dislocation/subluxation,  thromboembolic complications and other imponderables were discussed.  The patient acknowledged the explanation, agreed to proceed with the plan and consent was signed. Patient is being admitted for  inpatient treatment for surgery, pain control, PT, OT, prophylactic antibiotics, VTE prophylaxis, progressive ambulation and ADL's and discharge planning.The patient is planning to be discharged  home.   Rosalene Billings, PA-C Orthopedic Surgery [facility] [place] [phone]

## 2023-07-18 NOTE — Anesthesia Preprocedure Evaluation (Signed)
Anesthesia Evaluation  Patient identified by MRN, date of birth, ID band Patient awake    Reviewed: Allergy & Precautions, H&P , NPO status , Patient's Chart, lab work & pertinent test results  Airway Mallampati: II  TM Distance: >3 FB Neck ROM: Full    Dental no notable dental hx. (+) Teeth Intact, Dental Advisory Given   Pulmonary neg pulmonary ROS, former smoker   Pulmonary exam normal breath sounds clear to auscultation       Cardiovascular Exercise Tolerance: Good hypertension, Pt. on medications negative cardio ROS Normal cardiovascular exam Rhythm:Regular Rate:Normal  Echocardiogram 09/28/2022:  Normal LV systolic function with visual EF 60-65%. Left ventricle cavity  is normal in size. Normal left ventricular wall thickness. Normal global  wall motion. Normal diastolic filling pattern, normal LAP. Calculated EF  68%.  Structurally normal tricuspid valve with no regurgitation. No evidence of  pulmonary hypertension.     Neuro/Psych negative neurological ROS  negative psych ROS   GI/Hepatic negative GI ROS, Neg liver ROS,,,  Endo/Other  negative endocrine ROS    Renal/GU negative Renal ROS  negative genitourinary   Musculoskeletal negative musculoskeletal ROS (+) Arthritis ,    Abdominal   Peds negative pediatric ROS (+)  Hematology negative hematology ROS (+)   Anesthesia Other Findings   Reproductive/Obstetrics negative OB ROS                             Anesthesia Physical Anesthesia Plan  ASA: 3  Anesthesia Plan: MAC and Spinal   Post-op Pain Management: Minimal or no pain anticipated, Tylenol PO (pre-op)* and Celebrex PO (pre-op)*   Induction: Intravenous  PONV Risk Score and Plan: 2 and Propofol infusion and Treatment may vary due to age or medical condition  Airway Management Planned: Natural Airway, Simple Face Mask and Mask  Additional Equipment:  None  Intra-op Plan:   Post-operative Plan:   Informed Consent: I have reviewed the patients History and Physical, chart, labs and discussed the procedure including the risks, benefits and alternatives for the proposed anesthesia with the patient or authorized representative who has indicated his/her understanding and acceptance.       Plan Discussed with: Anesthesiologist and CRNA  Anesthesia Plan Comments:         Anesthesia Quick Evaluation

## 2023-07-19 ENCOUNTER — Encounter (HOSPITAL_COMMUNITY): Payer: Self-pay | Admitting: Orthopedic Surgery

## 2023-07-19 ENCOUNTER — Ambulatory Visit (HOSPITAL_BASED_OUTPATIENT_CLINIC_OR_DEPARTMENT_OTHER): Payer: Medicare Other | Admitting: Anesthesiology

## 2023-07-19 ENCOUNTER — Ambulatory Visit (HOSPITAL_COMMUNITY): Payer: Medicare Other

## 2023-07-19 ENCOUNTER — Encounter (HOSPITAL_COMMUNITY)
Admission: RE | Disposition: A | Discharge status: Home / Self Care | Payer: Self-pay | Source: Home / Self Care | Attending: Orthopedic Surgery

## 2023-07-19 ENCOUNTER — Ambulatory Visit (HOSPITAL_COMMUNITY): Payer: Self-pay | Admitting: Anesthesiology

## 2023-07-19 ENCOUNTER — Other Ambulatory Visit: Payer: Self-pay

## 2023-07-19 ENCOUNTER — Observation Stay (HOSPITAL_COMMUNITY)
Admission: RE | Admit: 2023-07-19 | Discharge: 2023-07-20 | Disposition: A | Discharge status: Home / Self Care | Payer: Medicare Other | Attending: Orthopedic Surgery | Admitting: Orthopedic Surgery

## 2023-07-19 DIAGNOSIS — Z79899 Other long term (current) drug therapy: Secondary | ICD-10-CM | POA: Diagnosis not present

## 2023-07-19 DIAGNOSIS — M1611 Unilateral primary osteoarthritis, right hip: Secondary | ICD-10-CM

## 2023-07-19 DIAGNOSIS — Z87891 Personal history of nicotine dependence: Secondary | ICD-10-CM | POA: Diagnosis not present

## 2023-07-19 DIAGNOSIS — I10 Essential (primary) hypertension: Secondary | ICD-10-CM | POA: Diagnosis not present

## 2023-07-19 DIAGNOSIS — Z96641 Presence of right artificial hip joint: Secondary | ICD-10-CM

## 2023-07-19 HISTORY — PX: TOTAL HIP ARTHROPLASTY: SHX124

## 2023-07-19 LAB — TYPE AND SCREEN
ABO/RH(D): A POS
Antibody Screen: NEGATIVE

## 2023-07-19 SURGERY — ARTHROPLASTY, HIP, TOTAL, ANTERIOR APPROACH
Anesthesia: Monitor Anesthesia Care | Site: Hip | Laterality: Right

## 2023-07-19 MED ORDER — OXYCODONE HCL 5 MG/5ML PO SOLN: 5.0000 mg | Freq: Once | ORAL | Status: DC | PRN

## 2023-07-19 MED ORDER — KETOROLAC TROMETHAMINE 30 MG/ML IJ SOLN
INTRAMUSCULAR | Status: AC
  Filled 2023-07-19: qty 1

## 2023-07-19 MED ORDER — CELECOXIB 200 MG PO CAPS
200.0000 mg | ORAL_CAPSULE | Freq: Once | ORAL | Status: AC
  Administered 2023-07-19: 200 mg via ORAL
  Filled 2023-07-19: qty 1

## 2023-07-19 MED ORDER — ONDANSETRON HCL 4 MG PO TABS
4.0000 mg | ORAL_TABLET | Freq: Four times a day (QID) | ORAL | Status: DC | PRN
Start: 2023-07-19 — End: 2023-07-20

## 2023-07-19 MED ORDER — ONDANSETRON HCL 4 MG/2ML IJ SOLN
INTRAMUSCULAR | Status: DC | PRN
  Administered 2023-07-19: 4 mg via INTRAVENOUS

## 2023-07-19 MED ORDER — MEPERIDINE HCL 50 MG/ML IJ SOLN
6.2500 mg | INTRAMUSCULAR | Status: DC | PRN
Start: 2023-07-19 — End: 2023-07-19

## 2023-07-19 MED ORDER — EPHEDRINE SULFATE-NACL 50-0.9 MG/10ML-% IV SOSY
PREFILLED_SYRINGE | INTRAVENOUS | Status: DC | PRN
  Administered 2023-07-19 (×2): 5 mg via INTRAVENOUS

## 2023-07-19 MED ORDER — OXYCODONE HCL 5 MG PO TABS: 5.0000 mg | ORAL_TABLET | Freq: Once | ORAL | Status: DC | PRN

## 2023-07-19 MED ORDER — DEXAMETHASONE SODIUM PHOSPHATE 10 MG/ML IJ SOLN: 8.0000 mg | Freq: Once | INTRAMUSCULAR | Status: DC

## 2023-07-19 MED ORDER — ACETAMINOPHEN 500 MG PO TABS
1000.0000 mg | ORAL_TABLET | Freq: Four times a day (QID) | ORAL | Status: DC
  Administered 2023-07-19 – 2023-07-20 (×4): 1000 mg via ORAL
  Filled 2023-07-19 (×4): qty 2

## 2023-07-19 MED ORDER — ROSUVASTATIN CALCIUM 5 MG PO TABS
5.0000 mg | ORAL_TABLET | Freq: Every day | ORAL | Status: DC
Start: 2023-07-20 — End: 2023-07-20
  Administered 2023-07-20: 5 mg via ORAL
  Filled 2023-07-19: qty 1

## 2023-07-19 MED ORDER — POVIDONE-IODINE 10 % EX SWAB: 2.0000 | Freq: Once | CUTANEOUS | Status: DC

## 2023-07-19 MED ORDER — BISACODYL 10 MG RE SUPP
10.0000 mg | Freq: Every day | RECTAL | Status: DC | PRN
Start: 2023-07-19 — End: 2023-07-20

## 2023-07-19 MED ORDER — DIPHENHYDRAMINE HCL 12.5 MG/5ML PO ELIX: 12.5000 mg | ORAL_SOLUTION | ORAL | Status: DC | PRN

## 2023-07-19 MED ORDER — SODIUM CHLORIDE 0.9% FLUSH
3.0000 mL | Freq: Two times a day (BID) | INTRAVENOUS | Status: DC
  Administered 2023-07-20: 10 mL via INTRAVENOUS

## 2023-07-19 MED ORDER — SENNA 8.6 MG PO TABS
2.0000 | ORAL_TABLET | Freq: Every day | ORAL | Status: DC
  Administered 2023-07-19: 17.2 mg via ORAL
  Filled 2023-07-19: qty 2

## 2023-07-19 MED ORDER — PROPOFOL 10 MG/ML IV BOLUS
INTRAVENOUS | Status: AC
  Filled 2023-07-19: qty 20

## 2023-07-19 MED ORDER — PROPOFOL 1000 MG/100ML IV EMUL
INTRAVENOUS | Status: AC
  Filled 2023-07-19: qty 100

## 2023-07-19 MED ORDER — ACETAMINOPHEN 500 MG PO TABS
ORAL_TABLET | ORAL | Status: AC
  Filled 2023-07-19: qty 2

## 2023-07-19 MED ORDER — ONDANSETRON HCL 4 MG/2ML IJ SOLN: 4.0000 mg | Freq: Once | INTRAMUSCULAR | Status: DC | PRN

## 2023-07-19 MED ORDER — SODIUM CHLORIDE 0.9% FLUSH
3.0000 mL | INTRAVENOUS | Status: DC | PRN
Start: 2023-07-19 — End: 2023-07-20

## 2023-07-19 MED ORDER — METOCLOPRAMIDE HCL 5 MG/ML IJ SOLN: 5.0000 mg | Freq: Three times a day (TID) | INTRAMUSCULAR | Status: DC | PRN

## 2023-07-19 MED ORDER — 0.9 % SODIUM CHLORIDE (POUR BTL) OPTIME
TOPICAL | Status: DC | PRN
  Administered 2023-07-19: 1000 mL

## 2023-07-19 MED ORDER — ONDANSETRON HCL 4 MG/2ML IJ SOLN
INTRAMUSCULAR | Status: AC
  Filled 2023-07-19: qty 2

## 2023-07-19 MED ORDER — LIDOCAINE HCL (PF) 2 % IJ SOLN
INTRAMUSCULAR | Status: AC
  Filled 2023-07-19: qty 5

## 2023-07-19 MED ORDER — MENTHOL 3 MG MT LOZG
1.0000 | LOZENGE | OROMUCOSAL | Status: DC | PRN
Start: 2023-07-19 — End: 2023-07-20

## 2023-07-19 MED ORDER — SODIUM CHLORIDE (PF) 0.9 % IJ SOLN
INTRAMUSCULAR | Status: AC
  Filled 2023-07-19: qty 30

## 2023-07-19 MED ORDER — FENTANYL CITRATE PF 50 MCG/ML IJ SOSY
25.0000 ug | PREFILLED_SYRINGE | INTRAMUSCULAR | Status: DC | PRN
Start: 2023-07-19 — End: 2023-07-19

## 2023-07-19 MED ORDER — ALUM & MAG HYDROXIDE-SIMETH 200-200-20 MG/5ML PO SUSP: 30.0000 mL | ORAL | Status: DC | PRN

## 2023-07-19 MED ORDER — SODIUM CHLORIDE 0.9% FLUSH: 10.0000 mL | Freq: Two times a day (BID) | INTRAVENOUS | Status: DC

## 2023-07-19 MED ORDER — TRANEXAMIC ACID-NACL 1000-0.7 MG/100ML-% IV SOLN
1000.0000 mg | INTRAVENOUS | Status: AC
Start: 2023-07-19 — End: 2023-07-19
  Administered 2023-07-19: 1000 mg via INTRAVENOUS
  Filled 2023-07-19: qty 100

## 2023-07-19 MED ORDER — ASPIRIN 81 MG PO CHEW
81.0000 mg | CHEWABLE_TABLET | Freq: Two times a day (BID) | ORAL | Status: DC
  Administered 2023-07-19 – 2023-07-20 (×2): 81 mg via ORAL
  Filled 2023-07-19 (×2): qty 1

## 2023-07-19 MED ORDER — PROPOFOL 500 MG/50ML IV EMUL
INTRAVENOUS | Status: DC | PRN
  Administered 2023-07-19: 50 ug/kg/min via INTRAVENOUS

## 2023-07-19 MED ORDER — LACTATED RINGERS IV SOLN: INTRAVENOUS | Status: DC

## 2023-07-19 MED ORDER — POLYETHYLENE GLYCOL 3350 17 G PO PACK
17.0000 g | PACK | Freq: Two times a day (BID) | ORAL | Status: DC
  Administered 2023-07-19 – 2023-07-20 (×2): 17 g via ORAL
  Filled 2023-07-19 (×2): qty 1

## 2023-07-19 MED ORDER — ONDANSETRON HCL 4 MG/2ML IJ SOLN: 4.0000 mg | Freq: Four times a day (QID) | INTRAMUSCULAR | Status: DC | PRN

## 2023-07-19 MED ORDER — OXYCODONE HCL 5 MG PO TABS: 10.0000 mg | ORAL_TABLET | ORAL | Status: DC | PRN

## 2023-07-19 MED ORDER — STERILE WATER FOR IRRIGATION IR SOLN
Status: DC | PRN
  Administered 2023-07-19: 1000 mL

## 2023-07-19 MED ORDER — METHOCARBAMOL 500 MG PO TABS
500.0000 mg | ORAL_TABLET | Freq: Four times a day (QID) | ORAL | Status: DC | PRN
  Administered 2023-07-19 (×2): 500 mg via ORAL
  Filled 2023-07-19 (×2): qty 1

## 2023-07-19 MED ORDER — DEXAMETHASONE SODIUM PHOSPHATE 10 MG/ML IJ SOLN
INTRAMUSCULAR | Status: AC
  Filled 2023-07-19: qty 1

## 2023-07-19 MED ORDER — PROPOFOL 10 MG/ML IV BOLUS
INTRAVENOUS | Status: DC | PRN
  Administered 2023-07-19: 10 mg via INTRAVENOUS
  Administered 2023-07-19: 20 mg via INTRAVENOUS

## 2023-07-19 MED ORDER — BUPIVACAINE IN DEXTROSE 0.75-8.25 % IT SOLN
INTRATHECAL | Status: DC | PRN
  Administered 2023-07-19: 1.6 mL via INTRATHECAL

## 2023-07-19 MED ORDER — OXYCODONE HCL 5 MG PO TABS
5.0000 mg | ORAL_TABLET | ORAL | Status: DC | PRN
  Administered 2023-07-19 – 2023-07-20 (×4): 5 mg via ORAL
  Filled 2023-07-19 (×4): qty 1

## 2023-07-19 MED ORDER — ACETAMINOPHEN 325 MG PO TABS: 325.0000 mg | ORAL_TABLET | ORAL | Status: DC | PRN

## 2023-07-19 MED ORDER — TRANEXAMIC ACID-NACL 1000-0.7 MG/100ML-% IV SOLN: 1000.0000 mg | Freq: Once | INTRAVENOUS | Status: DC

## 2023-07-19 MED ORDER — METHOCARBAMOL 1000 MG/10ML IJ SOLN: 500.0000 mg | Freq: Four times a day (QID) | INTRAMUSCULAR | Status: DC | PRN

## 2023-07-19 MED ORDER — ACETAMINOPHEN 500 MG PO TABS
1000.0000 mg | ORAL_TABLET | Freq: Once | ORAL | Status: AC
  Administered 2023-07-19: 1000 mg via ORAL
  Filled 2023-07-19: qty 2

## 2023-07-19 MED ORDER — DEXAMETHASONE SODIUM PHOSPHATE 10 MG/ML IJ SOLN
INTRAMUSCULAR | Status: DC | PRN
  Administered 2023-07-19: 8 mg via INTRAVENOUS

## 2023-07-19 MED ORDER — HYDROMORPHONE HCL 1 MG/ML IJ SOLN: 0.5000 mg | INTRAMUSCULAR | Status: DC | PRN

## 2023-07-19 MED ORDER — CEFAZOLIN SODIUM-DEXTROSE 2-4 GM/100ML-% IV SOLN
2.0000 g | INTRAVENOUS | Status: AC
  Administered 2023-07-19: 2 g via INTRAVENOUS
  Filled 2023-07-19: qty 100

## 2023-07-19 MED ORDER — TRANEXAMIC ACID-NACL 1000-0.7 MG/100ML-% IV SOLN
INTRAVENOUS | Status: AC
  Filled 2023-07-19: qty 100

## 2023-07-19 MED ORDER — EPHEDRINE 5 MG/ML INJ
INTRAVENOUS | Status: AC
  Filled 2023-07-19: qty 5

## 2023-07-19 MED ORDER — SODIUM CHLORIDE (PF) 0.9 % IJ SOLN
INTRAMUSCULAR | Status: DC | PRN
  Administered 2023-07-19: 61 mL

## 2023-07-19 MED ORDER — IRBESARTAN 75 MG PO TABS
37.5000 mg | ORAL_TABLET | Freq: Every day | ORAL | Status: DC
  Administered 2023-07-20: 37.5 mg via ORAL
  Filled 2023-07-19: qty 1

## 2023-07-19 MED ORDER — FENTANYL CITRATE PF 50 MCG/ML IJ SOSY: 25.0000 ug | PREFILLED_SYRINGE | INTRAMUSCULAR | Status: DC | PRN

## 2023-07-19 MED ORDER — CEFAZOLIN SODIUM-DEXTROSE 2-4 GM/100ML-% IV SOLN
2.0000 g | Freq: Four times a day (QID) | INTRAVENOUS | Status: AC
  Administered 2023-07-19 (×2): 2 g via INTRAVENOUS
  Filled 2023-07-19 (×2): qty 100

## 2023-07-19 MED ORDER — METOCLOPRAMIDE HCL 5 MG PO TABS: 5.0000 mg | ORAL_TABLET | Freq: Three times a day (TID) | ORAL | Status: DC | PRN

## 2023-07-19 MED ORDER — ACETAMINOPHEN 160 MG/5ML PO SOLN: 325.0000 mg | ORAL | Status: DC | PRN

## 2023-07-19 MED ORDER — FENTANYL CITRATE (PF) 100 MCG/2ML IJ SOLN
INTRAMUSCULAR | Status: AC
  Filled 2023-07-19: qty 2

## 2023-07-19 MED ORDER — PHENYLEPHRINE HCL-NACL 20-0.9 MG/250ML-% IV SOLN
INTRAVENOUS | Status: DC | PRN
  Administered 2023-07-19: 50 ug/min via INTRAVENOUS

## 2023-07-19 MED ORDER — PHENOL 1.4 % MT LIQD: 1.0000 | OROMUCOSAL | Status: DC | PRN

## 2023-07-19 MED ORDER — FENTANYL CITRATE (PF) 100 MCG/2ML IJ SOLN
INTRAMUSCULAR | Status: DC | PRN
  Administered 2023-07-19 (×2): 50 ug via INTRAVENOUS

## 2023-07-19 MED ORDER — CHLORHEXIDINE GLUCONATE 0.12 % MT SOLN
15.0000 mL | Freq: Once | OROMUCOSAL | Status: AC
  Administered 2023-07-19: 15 mL via OROMUCOSAL

## 2023-07-19 MED ORDER — ORAL CARE MOUTH RINSE: 15.0000 mL | Freq: Once | OROMUCOSAL | Status: AC

## 2023-07-19 MED ORDER — BUPIVACAINE-EPINEPHRINE 0.25% -1:200000 IJ SOLN
INTRAMUSCULAR | Status: AC
  Filled 2023-07-19: qty 1

## 2023-07-19 MED ORDER — DEXAMETHASONE SODIUM PHOSPHATE 10 MG/ML IJ SOLN
10.0000 mg | Freq: Once | INTRAMUSCULAR | Status: AC
  Administered 2023-07-20: 10 mg via INTRAVENOUS
  Filled 2023-07-19: qty 1

## 2023-07-19 SURGICAL SUPPLY — 39 items
ARTICULEZE HEAD (Hips) ×1 IMPLANT
BAG COUNTER SPONGE SURGICOUNT (BAG) IMPLANT
BAG ZIPLOCK 12X15 (MISCELLANEOUS) IMPLANT
BLADE SAG 18X100X1.27 (BLADE) ×1 IMPLANT
COVER PERINEAL POST (MISCELLANEOUS) ×1 IMPLANT
COVER SURGICAL LIGHT HANDLE (MISCELLANEOUS) ×1 IMPLANT
CUP ACETBLR 54 OD PINNACLE (Hips) IMPLANT
DERMABOND ADVANCED .7 DNX12 (GAUZE/BANDAGES/DRESSINGS) ×1 IMPLANT
DRAPE FOOT SWITCH (DRAPES) ×1 IMPLANT
DRAPE STERI IOBAN 125X83 (DRAPES) ×1 IMPLANT
DRAPE U-SHAPE 47X51 STRL (DRAPES) ×2 IMPLANT
DRESSING AQUACEL AG SP 3.5X10 (GAUZE/BANDAGES/DRESSINGS) ×1 IMPLANT
DRSG AQUACEL AG SP 3.5X10 (GAUZE/BANDAGES/DRESSINGS) ×1 IMPLANT
DURAPREP 26ML APPLICATOR (WOUND CARE) ×1 IMPLANT
ELECT REM PT RETURN 15FT ADLT (MISCELLANEOUS) ×1 IMPLANT
GLOVE BIO SURGEON STRL SZ 6 (GLOVE) ×1 IMPLANT
GLOVE BIOGEL PI IND STRL 6.5 (GLOVE) ×1 IMPLANT
GLOVE BIOGEL PI IND STRL 7.5 (GLOVE) ×1 IMPLANT
GLOVE ORTHO TXT STRL SZ7.5 (GLOVE) ×2 IMPLANT
GOWN STRL REUS W/ TWL LRG LVL3 (GOWN DISPOSABLE) ×2 IMPLANT
HEAD ARTICULEZE (Hips) IMPLANT
HOLDER FOLEY CATH W/STRAP (MISCELLANEOUS) ×1 IMPLANT
KIT TURNOVER KIT A (KITS) IMPLANT
LINER NEUTRAL 54X36MM PLUS 4 (Hips) IMPLANT
MANIFOLD NEPTUNE II (INSTRUMENTS) ×1 IMPLANT
NDL SAFETY ECLIPSE 18X1.5 (NEEDLE) IMPLANT
PACK ANTERIOR HIP CUSTOM (KITS) ×1 IMPLANT
SCREW PINN CAN 6.5X20 (Screw) IMPLANT
STEM FEMORAL SZ5 HIGH ACTIS (Stem) IMPLANT
SUT MNCRL AB 4-0 PS2 18 (SUTURE) ×1 IMPLANT
SUT STRATAFIX 0 PDS 27 VIOLET (SUTURE) ×1 IMPLANT
SUT VIC AB 1 CT1 36 (SUTURE) ×3 IMPLANT
SUT VIC AB 2-0 CT1 TAPERPNT 27 (SUTURE) ×2 IMPLANT
SUTURE STRATFX 0 PDS 27 VIOLET (SUTURE) ×1 IMPLANT
SYR 3ML LL SCALE MARK (SYRINGE) IMPLANT
TOWEL GREEN STERILE FF (TOWEL DISPOSABLE) ×1 IMPLANT
TRAY FOLEY MTR SLVR 14FR STAT (SET/KITS/TRAYS/PACK) IMPLANT
TUBE SUCTION HIGH CAP CLEAR NV (SUCTIONS) ×1 IMPLANT
WATER STERILE IRR 1000ML POUR (IV SOLUTION) ×1 IMPLANT

## 2023-07-19 NOTE — Discharge Instructions (Signed)

## 2023-07-19 NOTE — Evaluation (Addendum)
Physical Therapy Evaluation Patient Details Name: Phylicia Mercy MRN: [id number] DOB: [birth year] Today's Date: 07/19/2023  History of Present Illness  Pt is a 78 y.o f s/p R THA 07/19/23. PMH includes arthritis, hyperkalemia, hyperlipidemia, hypertension, intermittent palpitations  Clinical Impression  Pt is s/p THA resulting in the deficits listed below (see PT Problem List). Pt POD0 R THA with pain well managed at this time. Pt has decreased BP per her normal, but is able to recover well following changes in position. Pt able to complete supine to EOB transfer qwith min verbal cues, maintains sitting balance EOB, cues for hand placement during STS transfers. Pt able to amb x138ft with 2WW with good pace and pattern, no LOB noted throughout. Pt returns to recliner with callbell. Pt will benefit from acute skilled PT to increase their independence and safety with mobility to facilitate discharge.   Supine: 102/61 Sitting: 100/59 Stand: 91/38 (MAP 54) Sitting: 116/56 Post walk (seated): 104/66 Pt reports mild dizziness, with improvements noted following rest breaks. Pt motivated to amb with chair follow.         If plan is discharge home, recommend the following: A little help with walking and/or transfers   Can travel by private vehicle        Equipment Recommendations None recommended by PT  Recommendations for Other Services       Functional Status Assessment Patient has had a recent decline in their functional status and demonstrates the ability to make significant improvements in function in a reasonable and predictable amount of time.     Precautions / Restrictions Precautions Precautions: Fall Restrictions Weight Bearing Restrictions Per Provider Order: Yes RLE Weight Bearing Per Provider Order: Weight bearing as tolerated      Mobility  Bed Mobility Overal bed mobility: Modified Independent             General bed mobility comments: verbal cues fgor  technique    Transfers Overall transfer level: Needs assistance Equipment used: Rolling walker (2 wheels) Transfers: Sit to/from Stand, Bed to chair/wheelchair/BSC Sit to Stand: Supervision   Step pivot transfers: Supervision       General transfer comment: BP monitoring due to dizziness and low blood pressure with changes in posture or position    Ambulation/Gait Ambulation/Gait assistance: Supervision Gait Distance (Feet): 150 Feet Assistive device: Rolling walker (2 wheels) Gait Pattern/deviations: Step-through pattern       General Gait Details: equal bilateral reciprocal pattern, maintained gait line, 2WW used with good cadence  Stairs            Wheelchair Mobility     Tilt Bed    Modified Rankin (Stroke Patients Only)       Balance Overall balance assessment: Needs assistance Sitting-balance support: Bilateral upper extremity supported Sitting balance-Leahy Scale: Poor     Standing balance support: Bilateral upper extremity supported Standing balance-Leahy Scale: Poor                               Pertinent Vitals/Pain Pain Assessment Pain Assessment: Faces Faces Pain Scale: Hurts a little bit Pain Location: R hip Pain Descriptors / Indicators: Aching Pain Intervention(s): Premedicated before session    Home Living Family/patient expects to be discharged to:: Private residence Living Arrangements: Alone   Type of Home: Apartment Home Access: Level entry       Home Layout: One level Home Equipment: Cane - single Librarian, academic (2 wheels)  Prior Function Prior Level of Function : Independent/Modified Independent             Mobility Comments: Pt PLOF ind at [facility]       Extremity/Trunk Assessment                Communication   Communication Communication: No apparent difficulties  Cognition Arousal: Alert Behavior During Therapy: WFL for tasks assessed/performed Overall Cognitive Status:  Within Functional Limits for tasks assessed                                          General Comments General comments (skin integrity, edema, etc.): mild sway in standing, BUE support on walker    Exercises     Assessment/Plan    PT Assessment Patient needs continued PT services  PT Problem List Decreased strength;Decreased mobility;Decreased range of motion;Decreased activity tolerance;Decreased balance;Pain       PT Treatment Interventions Gait training;Balance training;Therapeutic exercise;Manual techniques;Functional mobility training;Therapeutic activities;Patient/family education;DME instruction    PT Goals (Current goals can be found in the Care Plan section)  Acute Rehab PT Goals Patient Stated Goal: return home PT Goal Formulation: With patient Time For Goal Achievement: 08/02/23 Potential to Achieve Goals: Good    Frequency 7X/week     Co-evaluation               AM-PAC PT "6 Clicks" Mobility  Outcome Measure Help needed turning from your back to your side while in a flat bed without using bedrails?: A Little Help needed moving from lying on your back to sitting on the side of a flat bed without using bedrails?: A Little Help needed moving to and from a bed to a chair (including a wheelchair)?: A Little Help needed standing up from a chair using your arms (e.g., wheelchair or bedside chair)?: A Little Help needed to walk in hospital room?: A Little Help needed climbing 3-5 steps with a railing? : A Little 6 Click Score: 18    End of Session Equipment Utilized During Treatment: Gait belt Activity Tolerance: Patient tolerated treatment well Patient left: in chair;with chair alarm set;with call bell/phone within reach Nurse Communication: Mobility status PT Visit Diagnosis: Unsteadiness on feet (R26.81);Other abnormalities of gait and mobility (R26.89)    Time: 4098-1191 PT Time Calculation (min) (ACUTE ONLY): 31 min   Charges:   PT  Evaluation $PT Eval Low Complexity: 1 Low PT Treatments $Gait Training: 8-22 mins PT General Charges $$ ACUTE PT VISIT: 1 Visit         Madaline Guthrie, PT Acute Rehabilitation Services Office: [phone] 07/19/2023   Evelena Peat 07/19/2023, 3:29 PM

## 2023-07-19 NOTE — Care Plan (Signed)
Ortho Bundle Case Management Note  Patient Details  Name: Chloe Cobb MRN: [id number] Date of Birth: 08-05-1945                  R THA on 07-19-23  DCP: SNF at [facility]. Lives alone in an apt.  DME: No needs, has a RW  PT: HEP   DME Arranged:  N/A DME Agency:      Additional Comments: Please contact me with any questions of if this plan should need to change.   Ennis Forts, RN,CCM [facility]  [phone] 07/19/2023, 10:52 AM

## 2023-07-19 NOTE — Anesthesia Postprocedure Evaluation (Signed)
Anesthesia Post Note  Patient: Chloe Cobb  Procedure(s) Performed: TOTAL HIP ARTHROPLASTY ANTERIOR APPROACH (Right: Hip)     Patient location during evaluation: PACU Anesthesia Type: MAC Level of consciousness: awake and alert Pain management: pain level controlled Vital Signs Assessment: post-procedure vital signs reviewed and stable Respiratory status: spontaneous breathing, nonlabored ventilation, respiratory function stable and patient connected to nasal cannula oxygen Cardiovascular status: stable and blood pressure returned to baseline Postop Assessment: no apparent nausea or vomiting Anesthetic complications: no   No notable events documented.  Last Vitals:  Vitals:   07/19/23 1349 07/19/23 1546  BP: 103/63 107/65  Pulse: 68 61  Resp: 17 17  Temp: (!) 36.3 C 36.5 C  SpO2: 97% 98%    Last Pain:  Vitals:   07/19/23 2019  TempSrc:   PainSc: 5                  Keirstan Iannello

## 2023-07-19 NOTE — Anesthesia Procedure Notes (Signed)
Spinal  Patient location during procedure: OR Start time: 07/19/2023 8:41 AM End time: 07/19/2023 8:44 AM Reason for block: surgical anesthesia Staffing Performed: anesthesiologist  Anesthesiologist: Kaylyn Layer, MD Performed by: Kaylyn Layer, MD Authorized by: Kaylyn Layer, MD   Preanesthetic Checklist Completed: patient identified, IV checked, risks and benefits discussed, surgical consent, monitors and equipment checked, pre-op evaluation and timeout performed Spinal Block Patient position: sitting Prep: DuraPrep and site prepped and draped Patient monitoring: continuous pulse ox, blood pressure and heart rate Approach: midline Location: L3-4 Injection technique: single-shot Needle Needle type: Pencan  Needle gauge: 24 G Needle length: 9 cm Assessment Events: CSF return Additional Notes Risks, benefits, and alternative discussed. Patient gave consent to procedure. Prepped and draped in sitting position. Patient sedated but responsive to voice. Clear CSF obtained after one needle pass. Positive terminal aspiration. No pain or paraesthesias with injection. Patient tolerated procedure well. Vital signs stable. Amalia Greenhouse, MD

## 2023-07-19 NOTE — Anesthesia Procedure Notes (Signed)
Date/Time: 07/19/2023 8:40 AM  Performed by: Florene Route, CRNAOxygen Delivery Method: Simple face mask

## 2023-07-19 NOTE — Op Note (Signed)
NAME:  Chloe Cobb                ACCOUNT NO.: [id number]      MEDICAL RECORD NO.: [id number]      FACILITY:  [facility]      PHYSICIAN:  Shelda Pal  DATE OF BIRTH:  1946/01/15     DATE OF PROCEDURE:  07/19/2023                                 OPERATIVE REPORT         PREOPERATIVE DIAGNOSIS: Right  hip osteoarthritis.      POSTOPERATIVE DIAGNOSIS:  Right hip osteoarthritis.      PROCEDURE:  Right total hip replacement through an anterior approach   utilizing DePuy THR system, component size 54 mm pinnacle cup, a size 36+4 neutral   Altrex liner, a size 5 Hi Actis stem with a 36+5 Articuleze metal head ball      SURGEON:  Madlyn Frankel. Charlann Boxer, M.D.      ASSISTANT:  Rosalene Billings, PA-C     ANESTHESIA:  Spinal.      SPECIMENS:  None.      COMPLICATIONS:  None.      BLOOD LOSS:  550 cc     DRAINS:  None.      INDICATION OF THE PROCEDURE:  [name] is a 78 y.o. female who had   presented to office for evaluation of right hip pain.  Radiographs revealed   progressive degenerative changes with bone-on-bone   articulation of the  hip joint, including subchondral cystic changes and osteophytes.  The patient had painful limited range of   motion significantly affecting their overall quality of life and function.  The patient was failing to    respond to conservative measures including medications and/or injections and activity modification and at this point was ready   to proceed with more definitive measures.  Consent was obtained for   benefit of pain relief.  Specific risks of infection, DVT, component   failure, dislocation, neurovascular injury, and need for revision surgery were reviewed in the office.     PROCEDURE IN DETAIL:  The patient was brought to operative theater.   Once adequate anesthesia, preoperative antibiotics, 2 gm of Ancef, 1 gm of Tranexamic Acid, and 10 mg of Decadron were administered, the patient was positioned supine on  the [employer] table.  Once the patient was safely positioned with adequate padding of boney prominences we predraped out the hip, and used fluoroscopy to confirm orientation of the pelvis.      The right hip was then prepped and draped from proximal iliac crest to   mid thigh with a shower curtain technique.      Time-out was performed identifying the patient, planned procedure, and the appropriate extremity.     An incision was then made 2 cm lateral to the   anterior superior iliac spine extending over the orientation of the   tensor fascia lata muscle and sharp dissection was carried down to the   fascia of the muscle.      The fascia was then incised.  The muscle belly was identified and swept   laterally and retractor placed along the superior neck.  Following   cauterization of the circumflex vessels and removing some pericapsular   fat, a second cobra retractor was placed on the inferior neck.  A T-capsulotomy was made along the line of the   superior neck to the trochanteric fossa, then extended proximally and   distally.  Tag sutures were placed and the retractors were then placed   intracapsular.  We then identified the trochanteric fossa and   orientation of my neck cut and then made a neck osteotomy with the femur on traction.  The femoral   head was removed without difficulty or complication.  Traction was let   off and retractors were placed posterior and anterior around the   acetabulum.      The labrum and foveal tissue were debrided.  I began reaming with a 48 mm   reamer and reamed up to 53 mm reamer with good bony bed preparation and a 54 mm  cup was chosen.  The final 54 mm Pinnacle cup was then impacted under fluoroscopy to confirm the depth of penetration and orientation with respect to   Abduction and forward flexion.  A screw was placed into the ilium followed by the hole eliminator.  The final   36+4 neutral Altrex liner was impacted with good visualized rim fit.   The cup was positioned anatomically within the acetabular portion of the pelvis.      At this point, the femur was rolled to 100 degrees.  Further capsule was   released off the inferior aspect of the femoral neck.  I then   released the superior capsule proximally.  With the leg in a neutral position the hook was placed laterally   along the femur under the vastus lateralis origin and elevated manually and then held in position using the hook attachment on the bed.  The leg was then extended and adducted with the leg rolled to 100   degrees of external rotation.  Retractors were placed along the medial calcar and posteriorly over the greater trochanter.  Once the proximal femur was fully   exposed, I used a box osteotome to set orientation.  I then began   broaching with the starting chili pepper broach and passed this by hand and then broached up to 5.  With the 5 broach in place I chose a high offfset neck and did several trial reductions.  The offset was appropriate, leg lengths   appeared to be equal best matched with the +5 head ball trial confirmed radiographically.   Given these findings, I went ahead and dislocated the hip, repositioned all   retractors and positioned the right hip in the extended and abducted position.  The final 5 Hi Actis stem was   chosen and it was impacted down to the level of neck cut.  Based on this   and the trial reductions, a final 36+5 Articuleze metal head ball was chosen and   impacted onto a clean and dry trunnion, and the hip was reduced.  The   hip had been irrigated throughout the case again at this point.  I did   reapproximate the superior capsular leaflet to the anterior leaflet   using #1 Vicryl.  The fascia of the   tensor fascia lata muscle was then reapproximated using #1 Vicryl and #0 Stratafix sutures.  The   remaining wound was closed with 2-0 Vicryl and running 4-0 Monocryl.   The hip was cleaned, dried, and dressed sterilely using Dermabond  and   Aquacel dressing.  The patient was then brought   to recovery room in stable condition tolerating the procedure well.    Rosalene Billings,  PA-C was present for the entirety of the case involved from   preoperative positioning, perioperative retractor management, general   facilitation of the case, as well as primary wound closure as assistant.            Madlyn Frankel Charlann Boxer, M.D.        07/19/2023 7:20 AM

## 2023-07-19 NOTE — Transfer of Care (Signed)
Immediate Anesthesia Transfer of Care Note  Patient: Chloe Cobb  Procedure(s) Performed: TOTAL HIP ARTHROPLASTY ANTERIOR APPROACH (Right: Hip)  Patient Location: PACU  Anesthesia Type:Spinal  Level of Consciousness: awake, alert , and oriented  Airway & Oxygen Therapy: Patient Spontanous Breathing and Patient connected to face mask oxygen  Post-op Assessment: Report given to RN and Post -op Vital signs reviewed and stable  Post vital signs: Reviewed and stable  Last Vitals:  Vitals Value Taken Time  BP 111/68 07/19/23 1015  Temp    Pulse 61 07/19/23 1018  Resp 17 07/19/23 1018  SpO2 100 % 07/19/23 1018  Vitals shown include unfiled device data.  Last Pain:  Vitals:   07/19/23 0655  TempSrc: Oral  PainSc:          Complications: No notable events documented.

## 2023-07-20 ENCOUNTER — Encounter (HOSPITAL_COMMUNITY): Payer: Self-pay | Admitting: Orthopedic Surgery

## 2023-07-20 DIAGNOSIS — M1611 Unilateral primary osteoarthritis, right hip: Secondary | ICD-10-CM | POA: Diagnosis not present

## 2023-07-20 LAB — CBC
HCT: 32.1 % — ABNORMAL LOW (ref 36.0–46.0)
Hemoglobin: 10.5 g/dL — ABNORMAL LOW (ref 12.0–15.0)
MCH: 30.4 pg (ref 26.0–34.0)
MCHC: 32.7 g/dL (ref 30.0–36.0)
MCV: 93 fL (ref 80.0–100.0)
Platelets: 163 10*3/uL (ref 150–400)
RBC: 3.45 MIL/uL — ABNORMAL LOW (ref 3.87–5.11)
RDW: 11.9 % (ref 11.5–15.5)
WBC: 13.5 10*3/uL — ABNORMAL HIGH (ref 4.0–10.5)
nRBC: 0 % (ref 0.0–0.2)

## 2023-07-20 LAB — BASIC METABOLIC PANEL
Anion gap: 5 (ref 5–15)
BUN: 15 mg/dL (ref 8–23)
CO2: 25 mmol/L (ref 22–32)
Calcium: 8.9 mg/dL (ref 8.9–10.3)
Chloride: 108 mmol/L (ref 98–111)
Creatinine, Ser: 0.69 mg/dL (ref 0.44–1.00)
GFR, Estimated: >60 mL/min (ref >60)
Glucose, Bld: 136 mg/dL — ABNORMAL HIGH (ref 70–99)
Potassium: 4.6 mmol/L (ref 3.5–5.1)
Sodium: 138 mmol/L (ref 135–145)

## 2023-07-20 LAB — ABO/RH: ABO/RH(D): A POS

## 2023-07-20 MED ORDER — ASPIRIN 81 MG PO CHEW: 81.0000 mg | CHEWABLE_TABLET | Freq: Two times a day (BID) | ORAL | 0 refills | Status: AC

## 2023-07-20 MED ORDER — POLYETHYLENE GLYCOL 3350 17 G PO PACK: 17.0000 g | PACK | Freq: Two times a day (BID) | ORAL | 0 refills | Status: AC

## 2023-07-20 MED ORDER — SODIUM CHLORIDE 0.9 % IV BOLUS
250.0000 mL | Freq: Once | INTRAVENOUS | Status: AC
  Administered 2023-07-20: 250 mL via INTRAVENOUS

## 2023-07-20 MED ORDER — SENNA 8.6 MG PO TABS: 2.0000 | ORAL_TABLET | Freq: Every day | ORAL | 0 refills | Status: AC

## 2023-07-20 MED ORDER — ACETAMINOPHEN 500 MG PO TABS: 1000.0000 mg | ORAL_TABLET | Freq: Four times a day (QID) | ORAL | 0 refills | Status: AC

## 2023-07-20 MED ORDER — METHOCARBAMOL 500 MG PO TABS: 500.0000 mg | ORAL_TABLET | Freq: Four times a day (QID) | ORAL | 2 refills | Status: AC | PRN

## 2023-07-20 MED ORDER — OXYCODONE HCL 5 MG PO TABS: 5.0000 mg | ORAL_TABLET | ORAL | 0 refills | Status: AC | PRN

## 2023-07-20 NOTE — Progress Notes (Signed)
Report called to [facility], patient awaiting transportation

## 2023-07-20 NOTE — TOC Transition Note (Signed)
Transition of Care [facility]) - Discharge Note   Patient Details  Name: Chloe Cobb MRN: [id number] Date of Birth: Nov 26, 1945  [facility] of [facility] CM/SW Contact:  Howell Rucks, RN Phone Number: 07/20/2023, 10:59 AM   Clinical Narrative:   Met with patient at bedside to review dc follow up and DME needs, patient confirmed resides at [facility] IL[facility], plan for short term rehab/SNF at [facility].NCM call to [facility], admissions coordinator, at [facility], confirmed acceptance for short term rehab, RM 157, Call Report [phone]. Cierra requesting PTAR transport secondary to surgical procedure, also requested updated FL2 and DC Summary sent through HUB. No further TOC needs.     Final next level of care: Skilled Nursing Facility Barriers to Discharge: No Barriers Identified   Patient Goals and CMS Choice Patient states their goals for this hospitalization and ongoing recovery are:: return home          Discharge Placement                       Discharge Plan and Services Additional resources added to the After Visit Summary for                  DME Arranged: N/A                    Social Drivers of Health (SDOH) Interventions SDOH Screenings   Food Insecurity: No Food Insecurity (07/19/2023)  Housing: Low Risk  (07/19/2023)  Transportation Needs: No Transportation Needs (07/19/2023)  Utilities: Not At Risk (07/19/2023)  Social Connections: Patient Declined (07/19/2023)  Tobacco Use: Medium Risk (07/19/2023)     Readmission Risk Interventions     No data to display

## 2023-07-20 NOTE — Progress Notes (Signed)
Physical Therapy Treatment Patient Details Name: Chloe Cobb MRN: [id number] DOB: 1945/11/01 Today's Date: 07/20/2023   History of Present Illness Pt is a 78 y.o f s/p R THA 07/19/23. PMH includes arthritis, hyperkalemia, hyperlipidemia, hypertension, intermittent palpitations    PT Comments  Pt is POD # 1 and is progressing well.  Pt with excellent pain control and understanding of HEP.  She ambulated 200' with RW and supervision.  Pt had no lightheadedness or dizziness today. Her plan is to return to [facility] for a few weeks before return to ILF.  Pt demonstrates safe gait & transfers in order to return home from PT perspective once discharged by MD.  While in hospital, will continue to benefit from PT for skilled therapy to advance mobility and exercises.       If plan is discharge home, recommend the following: A little help with walking and/or transfers;A little help with bathing/dressing/bathroom;Assistance with cooking/housework;Help with stairs or ramp for entrance   Can travel by private vehicle        Equipment Recommendations  None recommended by PT    Recommendations for Other Services       Precautions / Restrictions Precautions Precautions: Fall Restrictions Weight Bearing Restrictions Per Provider Order: No RLE Weight Bearing Per Provider Order: Weight bearing as tolerated     Mobility  Bed Mobility Overal bed mobility: Modified Independent                  Transfers Overall transfer level: Needs assistance Equipment used: Rolling walker (2 wheels) Transfers: Sit to/from Stand, Bed to chair/wheelchair/BSC Sit to Stand: Supervision           General transfer comment: Denies dizziness; performed STS x 2 safely    Ambulation/Gait Ambulation/Gait assistance: Contact guard assist, Supervision Gait Distance (Feet): 200 Feet Assistive device: Rolling walker (2 wheels) Gait Pattern/deviations: Step-through pattern Gait velocity: decreased  but functional     General Gait Details: CGA progressing to supervision; noted R foot with internal rotation - able to correct with cues   Stairs Stairs:  (no stairs at home)           Wheelchair Mobility     Tilt Bed    Modified Rankin (Stroke Patients Only)       Balance Overall balance assessment: Needs assistance Sitting-balance support: No upper extremity supported Sitting balance-Leahy Scale: Good     Standing balance support: No upper extremity supported, Bilateral upper extremity supported Standing balance-Leahy Scale: Good Standing balance comment: RW to ambulate - steady but able to stand without UE support                            Cognition Arousal: Alert Behavior During Therapy: WFL for tasks assessed/performed                                   General Comments: pleasant and motivated        Exercises Total Joint Exercises Ankle Circles/Pumps: AROM, Both, 10 reps, Supine Quad Sets: AROM, Both, 10 reps, Supine Heel Slides: AAROM, Right, 10 reps, Supine Hip ABduction/ADduction: AAROM, Right, 5 reps, Supine (limited reps due to pain) Long Arc Quad: AROM, Right, 10 reps, Seated Other Exercises Other Exercises: Standing with RW R AROM 10 reps: marching, hip abd, hip ext, H-S curl; tolerated well, reports feels good to stretch    General  Comments  Educated on safe ice use, no pivots, car transfers. Also, encouraged walking every 1-2 hours during day. Educated on HEP with focus on mobility the first weeks. Discussed doing exercises within pain control and if pain increasing could decreased ROM, reps, and stop exercises as needed. Encouraged to perform ankle pumps frequently for blood flow      Pertinent Vitals/Pain Pain Assessment Pain Assessment: 0-10 Pain Score: 2  Pain Location: R hip Pain Descriptors / Indicators: Aching Pain Intervention(s): Limited activity within patient's tolerance, Monitored during session,  Premedicated before session, Repositioned, Ice applied    Home Living                          Prior Function            PT Goals (current goals can now be found in the care plan section) Progress towards PT goals: Progressing toward goals    Frequency    7X/week      PT Plan      Co-evaluation              AM-PAC PT "6 Clicks" Mobility   Outcome Measure  Help needed turning from your back to your side while in a flat bed without using bedrails?: None Help needed moving from lying on your back to sitting on the side of a flat bed without using bedrails?: None Help needed moving to and from a bed to a chair (including a wheelchair)?: A Little Help needed standing up from a chair using your arms (e.g., wheelchair or bedside chair)?: A Little Help needed to walk in hospital room?: A Little Help needed climbing 3-5 steps with a railing? : A Little 6 Click Score: 20    End of Session Equipment Utilized During Treatment: Gait belt Activity Tolerance: Patient tolerated treatment well Patient left: in chair;with chair alarm set;with call bell/phone within reach Nurse Communication: Mobility status PT Visit Diagnosis: Unsteadiness on feet (R26.81);Other abnormalities of gait and mobility (R26.89)     Time: 0865-7846 PT Time Calculation (min) (ACUTE ONLY): 29 min  Charges:    $Gait Training: 8-22 mins $Therapeutic Exercise: 8-22 mins PT General Charges $$ ACUTE PT VISIT: 1 Visit                     Anise Salvo, PT Acute Rehab Services [facility] [phone]    Rayetta Humphrey 07/20/2023, 11:15 AM

## 2023-07-20 NOTE — Progress Notes (Signed)
   Subjective: 1 Day Post-Op Procedure(s) (LRB): TOTAL HIP ARTHROPLASTY ANTERIOR APPROACH (Right) Patient reports pain as mild.   Patient seen in rounds with Dr. Charlann Boxer. Patient is resting in bed on exam this morning. No acute events overnight. Foley catheter removed. Patient ambulated 120 feet with PT yesterday. She had mild dizziness/orthostasis with PT.  We will start therapy today.   Objective: Vital signs in last 24 hours: Temp:  [96.1 F (35.6 C)-98.7 F (37.1 C)] 97.8 F (36.6 C) (01/22 0540) Pulse Rate:  [49-68] 57 (01/22 0540) Resp:  [8-27] 15 (01/22 0540) BP: (99-120)/(50-73) 99/56 (01/22 0540) SpO2:  [91 %-99 %] 96 % (01/22 0540)  Intake/Output from previous day:  Intake/Output Summary (Last 24 hours) at 07/20/2023 0748 Last data filed at 07/20/2023 0606 Gross per 24 hour  Intake 2120 ml  Output 2150 ml  Net -30 ml     Intake/Output this shift: No intake/output data recorded.  Labs: Recent Labs    07/20/23 0335  HGB 10.5*   Recent Labs    07/20/23 0335  WBC 13.5*  RBC 3.45*  HCT 32.1*  PLT 163   Recent Labs    07/20/23 0335  NA 138  K 4.6  CL 108  CO2 25  BUN 15  CREATININE 0.69  GLUCOSE 136*  CALCIUM 8.9   No results for input(s): "LABPT", "INR" in the last 72 hours.  Exam: General - Patient is Alert and Oriented Extremity - Neurologically intact Sensation intact distally Intact pulses distally Dorsiflexion/Plantar flexion intact Dressing - dressing C/D/I Motor Function - intact, moving foot and toes well on exam.   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   Weight as of this encounter: 74.4 kg. Advance diet Up with therapy D/C IV fluids  DVT Prophylaxis - Aspirin Weight bearing as tolerated.  Hgb stable at 10.5 this AM. BP soft, will give bolus this AM  Plan is to go to [facility] rehab for a week or so then to her private residence at [employer] for discharge today after meeting goals with therapy. Follow up in the office in 2 weeks.   Rosalene Billings, PA-C Orthopedic Surgery [phone] 07/20/2023, 7:48 AM

## 2023-07-20 NOTE — [place] FL2 (Signed)
[place] MEDICAID FL2 LEVEL OF CARE FORM     IDENTIFICATION  Patient Name: Chloe Cobb Birthdate: May 31, 1946 Sex: female Admission Date (Current Location): 07/19/2023  [facility] and [place] Number:  Producer, television/film/video and Address:  [facility],  [address] [place], [place] [address]      Provider Number: [id number]  Attending Physician Name and Address:  Durene Romans, MD  Relative Name and Phone Number:  Nellia, Buckman (Spouse)  [phone] (Mobile)    Current Level of Care: Hospital Recommended Level of Care: Skilled Nursing Facility Prior Approval Number:    Date Approved/Denied:   PASRR Number:    Discharge Plan: SNF    Current Diagnoses: Patient Active Problem List   Diagnosis Date Noted   S/P total right hip arthroplasty 07/19/2023    Orientation RESPIRATION BLADDER Height & Weight     Self, Time, Situation, Place  Normal Continent Weight: 74.4 kg Height:  5\' 5"  (165.1 cm)  BEHAVIORAL SYMPTOMS/MOOD NEUROLOGICAL BOWEL NUTRITION STATUS      Continent Diet  AMBULATORY STATUS COMMUNICATION OF NEEDS Skin   Limited Assist Verbally Normal                       Personal Care Assistance Level of Assistance  Bathing, Feeding, Dressing Bathing Assistance: Limited assistance Feeding assistance: Limited assistance Dressing Assistance: Limited assistance     Functional Limitations Info  Sight, Hearing, Speech Sight Info: Adequate Hearing Info: Adequate Speech Info: Adequate    SPECIAL CARE FACTORS FREQUENCY  PT (By licensed PT), OT (By licensed OT)     PT Frequency: 5x/wk OT Frequency: 5x/wk            Contractures Contractures Info: Not present    Additional Factors Info  Code Status, Allergies, Psychotropic Code Status Info: Full Code Allergies Info: Hydrogen Peroxide, Other, Beeswax Psychotropic Info: N/A         Current Medications (07/20/2023):  This is the current hospital active medication list Current  Facility-Administered Medications  Medication Dose Route Frequency Provider Last Rate Last Admin   acetaminophen (TYLENOL) tablet 1,000 mg  1,000 mg Oral Q6H Rosalene Billings R, PA-C   1,000 mg at 07/19/23 2356   alum & mag hydroxide-simeth (MAALOX/MYLANTA) 200-200-20 MG/5ML suspension 30 mL  30 mL Oral Q4H PRN Cassandria Anger, PA-C       aspirin chewable tablet 81 mg  81 mg Oral BID Cassandria Anger, PA-C   81 mg at 07/20/23 0827   bisacodyl (DULCOLAX) suppository 10 mg  10 mg Rectal Daily PRN Cassandria Anger, PA-C       diphenhydrAMINE (BENADRYL) 12.5 MG/5ML elixir 12.5-25 mg  12.5-25 mg Oral Q4H PRN Cassandria Anger, PA-C       HYDROmorphone (DILAUDID) injection 0.5-1 mg  0.5-1 mg Intravenous Q4H PRN Cassandria Anger, PA-C       irbesartan (AVAPRO) tablet 37.5 mg  37.5 mg Oral Daily Cassandria Anger, PA-C   37.5 mg at 07/20/23 0827   menthol-cetylpyridinium (CEPACOL) lozenge 3 mg  1 lozenge Oral PRN Cassandria Anger, PA-C       Or   phenol (CHLORASEPTIC) mouth spray 1 spray  1 spray Mouth/Throat PRN Cassandria Anger, PA-C       methocarbamol (ROBAXIN) tablet 500 mg  500 mg Oral Q6H PRN Rosalene Billings R, PA-C   500 mg at 07/19/23 2020   Or   methocarbamol (ROBAXIN) injection 500 mg  500 mg Intravenous Q6H  PRN Cassandria Anger, PA-C       metoCLOPramide (REGLAN) tablet 5-10 mg  5-10 mg Oral Q8H PRN Cassandria Anger, PA-C       Or   metoCLOPramide (REGLAN) injection 5-10 mg  5-10 mg Intravenous Q8H PRN Cassandria Anger, PA-C       ondansetron [facility]) tablet 4 mg  4 mg Oral Q6H PRN Cassandria Anger, PA-C       Or   ondansetron [facility]) injection 4 mg  4 mg Intravenous Q6H PRN Cassandria Anger, PA-C       oxyCODONE (Oxy IR/ROXICODONE) immediate release tablet 10-15 mg  10-15 mg Oral Q4H PRN Cassandria Anger, PA-C       oxyCODONE (Oxy IR/ROXICODONE) immediate release tablet 5-10 mg  5-10 mg Oral Q4H PRN Rosalene Billings R, PA-C   5 mg at 07/20/23 5784   polyethylene glycol  (MIRALAX / GLYCOLAX) packet 17 g  17 g Oral BID Cassandria Anger, PA-C   17 g at 07/20/23 0827   rosuvastatin (CRESTOR) tablet 5 mg  5 mg Oral Daily Cassandria Anger, PA-C   5 mg at 07/20/23 0827   senna (SENOKOT) tablet 17.2 mg  2 tablet Oral QHS Cassandria Anger, PA-C   17.2 mg at 07/19/23 2020   sodium chloride flush (NS) 0.9 % injection 3-10 mL  3-10 mL Intravenous Q12H Cassandria Anger, PA-C   10 mL at 07/20/23 6962   sodium chloride flush (NS) 0.9 % injection 3-10 mL  3-10 mL Intravenous PRN Cassandria Anger, PA-C       tranexamic acid (CYKLOKAPRON) IVPB 1,000 mg  1,000 mg Intravenous Once Cassandria Anger, PA-C         Discharge Medications: Please see discharge summary for a list of discharge medications.  Relevant Imaging Results:  Relevant Lab Results:   Additional Information SSN: [id number]  Howell Rucks, RN

## 2023-07-20 NOTE — Care Management (Addendum)
Received a call from day shift NCM requesting assistance with DC summary and FL2 being sent to [facility]. Per chart review FL2 has not been co-signed. This RNCM spoke with Morrie Sheldon PA who will have FL2 co-signed.  Once completed will upload to [facility]  TOC following  -5:22pm Uploaded FL2 & dc summary in the HUB.

## 2023-07-20 NOTE — Care Management Obs Status (Signed)
MEDICARE OBSERVATION STATUS NOTIFICATION   Patient Details  Name: Chloe Cobb MRN: [id number] Date of Birth: Dec 02, 1945   Medicare Observation Status Notification Given:  Yes    Howell Rucks, RN 07/20/2023, 10:03 AM

## 2023-07-20 NOTE — Plan of Care (Signed)
  Problem: Education: Goal: Knowledge of General Education information will improve Description: Including pain rating scale, medication(s)/side effects and non-pharmacologic comfort measures Outcome: Progressing   Problem: Activity: Goal: Risk for activity intolerance will decrease Outcome: Progressing   Problem: Pain Managment: Goal: General experience of comfort will improve and/or be controlled Outcome: Progressing

## 2023-07-21 ENCOUNTER — Non-Acute Institutional Stay (SKILLED_NURSING_FACILITY): Payer: Medicare Other | Admitting: Adult Health

## 2023-07-21 ENCOUNTER — Encounter: Payer: Self-pay | Admitting: Adult Health

## 2023-07-21 DIAGNOSIS — M1611 Unilateral primary osteoarthritis, right hip: Secondary | ICD-10-CM

## 2023-07-21 DIAGNOSIS — Z96641 Presence of right artificial hip joint: Secondary | ICD-10-CM | POA: Diagnosis not present

## 2023-07-21 DIAGNOSIS — I10 Essential (primary) hypertension: Secondary | ICD-10-CM

## 2023-07-21 DIAGNOSIS — E782 Mixed hyperlipidemia: Secondary | ICD-10-CM | POA: Diagnosis not present

## 2023-07-21 DIAGNOSIS — D649 Anemia, unspecified: Secondary | ICD-10-CM

## 2023-07-21 DIAGNOSIS — K5901 Slow transit constipation: Secondary | ICD-10-CM

## 2023-07-21 NOTE — Progress Notes (Signed)
Location:  Medical illustrator of Service:  SNF (31) Provider:  Peggye Ley, ANP [facility] [phone]  Code Status: Full Goals of Care:     07/19/2023    1:02 PM  Advanced Directives  Does Patient Have a Medical Advance Directive? Yes  Type of Estate agent of [place];Living will  Does patient want to make changes to medical advance directive? No - Patient declined  Copy of Healthcare Power of Attorney in Chart? No - copy requested     Chief Complaint  Patient presents with   Hospitalization Follow-up    HPI: Patient is a 78 y.o. female seen today for hospital follow-up after having  a right total hip arthroplasty on 07/19/23 by Durene Romans MD  PMH significant for HTN, HLD, palpitations.   She is in skilled rehab at [facility] to receive therapy.   Reports her pain is controlled She is weight bearing as tolerated Vitals normal No complaints.  Her goal is to be able to ambulate well at [facility] from her apartment to her husbands room in skilled care.   Hgb 10.5 07/20/23  Past Medical History:  Diagnosis Date   Abnormal mammogram    Allergic reaction to substance    Allergy    Arthritis    Environmental allergies    Hyperkalemia    Hyperlipidemia    Hypertension    Intermittent palpitations    Mass of right breast on mammogram    Palpitation     Past Surgical History:  Procedure Laterality Date   TOTAL HIP ARTHROPLASTY Right 07/19/2023   Procedure: TOTAL HIP ARTHROPLASTY ANTERIOR APPROACH;  Surgeon: Durene Romans, MD;  Location: [facility];  Service: Orthopedics;  Laterality: Right;   WISDOM TOOTH EXTRACTION      Allergies  Allergen Reactions   Hydrogen Peroxide Swelling     facial swelling   Other Swelling    DUST   Beeswax Swelling and Rash    Outpatient Encounter Medications as of 07/21/2023  Medication Sig   acetaminophen (TYLENOL) 500 MG tablet Take 2 tablets (1,000 mg total) by mouth  every 6 (six) hours.   alclomethasone (ACLOVATE) 0.05 % ointment Apply 1 Application topically 2 (two) times daily as needed (irritation).   aspirin 81 MG chewable tablet Chew 1 tablet (81 mg total) by mouth 2 (two) times daily for 28 days.   BIOTIN PO Take 1 capsule by mouth daily.   Cyanocobalamin (B-12 PO) Take 1 capsule by mouth daily.   hydrocortisone cream 1 % Apply 1 Application topically 2 (two) times daily as needed for itching.   methocarbamol (ROBAXIN) 500 MG tablet Take 1 tablet (500 mg total) by mouth every 6 (six) hours as needed for muscle spasms (thigh pain / muscle pain).   olmesartan (BENICAR) 5 MG tablet Take 1 tablet (5 mg total) by mouth daily.   oxyCODONE (OXY IR/ROXICODONE) 5 MG immediate release tablet Take 1 tablet (5 mg total) by mouth every 4 (four) hours as needed for severe pain (pain score 7-10).   polyethylene glycol (MIRALAX / GLYCOLAX) 17 g packet Take 17 g by mouth 2 (two) times daily.   predniSONE (DELTASONE) 10 MG tablet Take 40 mg by mouth daily as needed (allergies around eyes / swollen eyes). Take for 3 days in a row   rosuvastatin (CRESTOR) 5 MG tablet Take 1 tablet by mouth everyday at bedtime   senna (SENOKOT) 8.6 MG TABS tablet Take 2 tablets (17.2 mg total)  by mouth at bedtime for 14 days.   Sodium Fluoride 1.1 % PSTE Apply pea size amount onto brush and apply to all surfaces of teeth for 2 minutes per day. (Patient taking differently: Place 1 Application onto teeth once a week.)   VITAMIN D PO Take 1 capsule by mouth daily.   [DISCONTINUED] miconazole (ZEASORB-AF) 2 % powder Apply daily to affected area. (Patient taking differently: Apply 1 Application topically daily as needed (dryness/yeast).)   [DISCONTINUED] nystatin ointment (MYCOSTATIN) Apply 1 Application topically 2 (two) times daily as needed (irritation).   [DISCONTINUED] tacrolimus (PROTOPIC) 0.1 % ointment Use 1 application Externally Once a day 30 days   [DISCONTINUED] triamcinolone cream  (KENALOG) 0.1 % 1 application topically BID 14 days (Patient taking differently: Apply 1 Application topically 2 (two) times daily as needed (irritation).)   No facility-administered encounter medications on file as of 07/21/2023.    Review of Systems:  Review of Systems  Constitutional:  Positive for activity change. Negative for appetite change, chills, diaphoresis, fatigue, fever and unexpected weight change.  HENT:  Negative for congestion.   Respiratory:  Negative for cough, shortness of breath and wheezing.   Cardiovascular:  Negative for chest pain, palpitations and leg swelling.  Gastrointestinal:  Negative for abdominal distention, abdominal pain, constipation and diarrhea.  Genitourinary:  Negative for difficulty urinating and dysuria.  Musculoskeletal:  Positive for arthralgias and gait problem. Negative for back pain, joint swelling and myalgias.  Neurological:  Negative for dizziness, tremors, seizures, syncope, facial asymmetry, speech difficulty, weakness, light-headedness, numbness and headaches.  Psychiatric/Behavioral:  Negative for agitation, behavioral problems and confusion.     Health Maintenance  Topic Date Due   Medicare Annual Wellness (AWV)  Never done   Hepatitis C Screening  Never done   DTaP/Tdap/Td (3 - Tdap) 12/27/2021   INFLUENZA VACCINE  01/27/2023   COVID-19 Vaccine (8 - 2024-25 season) 02/27/2023   DEXA SCAN  06/08/2024 (Originally 08/31/2008)   Pneumonia Vaccine 41+ Years old  Completed   Zoster Vaccines- Shingrix  Completed   HPV VACCINES  Aged Out    Physical Exam: Vitals:   07/21/23 1634  BP: 125/74  Pulse: (!) 57  Resp: 12  Temp: 97.6 F (36.4 C)  SpO2: 93%   There is no height or weight on file to calculate BMI. Physical Exam Vitals and nursing note reviewed.  Constitutional:      General: She is not in acute distress.    Appearance: She is not diaphoretic.  HENT:     Head: Normocephalic and atraumatic.  Neck:     Vascular: No  JVD.  Cardiovascular:     Rate and Rhythm: Normal rate and regular rhythm.     Heart sounds: No murmur heard. Pulmonary:     Effort: Pulmonary effort is normal. No respiratory distress.     Breath sounds: Normal breath sounds. No wheezing.  Musculoskeletal:     Right lower leg: No edema.     Left lower leg: No edema.  Skin:    General: Skin is warm and dry.     Comments: Right hip dressing CDI Small circular area of redness at the edge of the dressing. Nurse to monitor.  Neurological:     Mental Status: She is alert and oriented to person, place, and time.     Labs reviewed: Basic Metabolic Panel: Recent Labs    07/12/23 1030 07/20/23 0335  NA 139 138  K 5.2* 4.6  CL 107 108  CO2 25  25  GLUCOSE 97 136*  BUN 17 15  CREATININE 0.85 0.69  CALCIUM 9.5 8.9   Liver Function Tests: No results for input(s): "AST", "ALT", "ALKPHOS", "BILITOT", "PROT", "ALBUMIN" in the last 8760 hours. No results for input(s): "LIPASE", "AMYLASE" in the last 8760 hours. No results for input(s): "AMMONIA" in the last 8760 hours. CBC: Recent Labs    07/12/23 1030 07/20/23 0335  WBC 5.4 13.5*  HGB 13.8 10.5*  HCT 42.4 32.1*  MCV 93.8 93.0  PLT 198 163   Lipid Panel: No results for input(s): "CHOL", "HDL", "LDLCALC", "TRIG", "CHOLHDL", "LDLDIRECT" in the last 8760 hours. No results found for: "HGBA1C"  Procedures since last visit: DG Pelvis Portable Result Date: 07/19/2023 CLINICAL DATA:  Postop. EXAM: PORTABLE PELVIS 1-2 VIEWS COMPARISON:  None Available. FINDINGS: Right hip arthroplasty in expected alignment. No periprosthetic lucency or fracture. Recent postsurgical change includes air and edema in the soft tissues. IMPRESSION: Right hip arthroplasty without immediate postoperative complication. Electronically Signed   By: Narda Rutherford M.D.   On: 07/19/2023 11:28   DG HIP UNILAT WITH PELVIS 1V RIGHT Result Date: 07/19/2023 CLINICAL DATA:  Elective surgery. EXAM: DG HIP (WITH OR  WITHOUT PELVIS) 1V RIGHT COMPARISON:  None Available. FINDINGS: Two fluoroscopic spot views of the pelvis and right hip obtained in the operating room. Images during hip arthroplasty. Fluoroscopy time 12 seconds. Dose 1.5 mGy. IMPRESSION: Intraoperative fluoroscopy during right hip arthroplasty. Electronically Signed   By: Narda Rutherford M.D.   On: 07/19/2023 11:28   DG C-Arm 1-60 Min-No Report Result Date: 07/19/2023 Fluoroscopy was utilized by the requesting physician.  No radiographic interpretation.    Assessment/Plan  1. Primary osteoarthritis of right hip (Primary) Led to #2  2. S/P total right hip arthroplasty Will be working with therapy [place]al to [place] On asa bid x 28 days per ortho Pain controlled F/u with ortho as indicated.   3. Essential hypertension Controlled On Olmesartan  4. Mixed hyperlipidemia On crestor   5. Slow transit constipation Continue miralax and senokot   6. Anemia Hgb 10.5 Mild due to blood loss from surgery   Labs/tests ordered:  * No order type specified * Next appt:  Visit date not found

## 2023-07-25 ENCOUNTER — Non-Acute Institutional Stay (SKILLED_NURSING_FACILITY): Payer: Self-pay | Admitting: Internal Medicine

## 2023-07-25 DIAGNOSIS — E782 Mixed hyperlipidemia: Secondary | ICD-10-CM

## 2023-07-25 DIAGNOSIS — Z96641 Presence of right artificial hip joint: Secondary | ICD-10-CM | POA: Diagnosis not present

## 2023-07-25 DIAGNOSIS — I10 Essential (primary) hypertension: Secondary | ICD-10-CM | POA: Diagnosis not present

## 2023-07-25 DIAGNOSIS — K5901 Slow transit constipation: Secondary | ICD-10-CM

## 2023-07-25 DIAGNOSIS — D649 Anemia, unspecified: Secondary | ICD-10-CM

## 2023-07-25 NOTE — Progress Notes (Signed)
Provider:   Location:  Medical illustrator of Service:  SNF (31)  PCP: Aliene Beams, MD Patient Care Team: Aliene Beams, MD as PCP - General (Family Medicine) Jake Bathe, MD as PCP - Cardiology (Cardiology)  Extended Emergency Contact Information Primary Emergency Contact: Linsley,Robert Home Phone: [phone] Mobile Phone: [phone] Relation: Spouse Secondary Emergency Contact: White,Dusty Mobile Phone: [phone] Relation: Nephew  Code Status: DNR Goals of Care: Advanced Directive information    07/19/2023    1:02 PM  Advanced Directives  Does Patient Have a Medical Advance Directive? Yes  Type of Estate agent of [place];Living will  Does patient want to make changes to medical advance directive? No - Patient declined  Copy of Healthcare Power of Attorney in Chart? No - copy requested      Chief Complaint  Patient presents with   New Admit To SNF    Patient is being seen for a new admission    HPI: Patient is a 78 y.o. female seen today for admission to Rehab  Lives in [place] Very Active  Has h/o HTN HLD   Had Elective Right Total Hip Arthroplasty on 07/19/2023 by Dr Charlann Boxer  Doing well Walking with her walker controlled with Tylenol She is weightbearing as tolerated Yesterday patient had some compression stockings provided by the facility.  Patient states that they were very tight and since then she noticed some bruising in and around her knee and pain in her leg.  But today she is not wearing her stockings and she feels better. Her leg does have mild edema but it is not tender anymore.    Past Medical History:  Diagnosis Date   Abnormal mammogram    Allergic reaction to substance    Allergy    Arthritis    Environmental allergies    Hyperkalemia    Hyperlipidemia    Hypertension    Intermittent palpitations    Mass of right breast on mammogram    Palpitation    Past Surgical History:   Procedure Laterality Date   TOTAL HIP ARTHROPLASTY Right 07/19/2023   Procedure: TOTAL HIP ARTHROPLASTY ANTERIOR APPROACH;  Surgeon: Durene Romans, MD;  Location: [facility];  Service: Orthopedics;  Laterality: Right;   WISDOM TOOTH EXTRACTION      reports that she quit smoking about 39 years ago. Her smoking use included cigarettes. She does not have any smokeless tobacco history on file. She reports current alcohol use of about 1.0 standard drink of alcohol per week. She reports that she does not use drugs. Social History   Socioeconomic History   Marital status: Married    Spouse name: Not on file   Number of children: 0   Years of education: Not on file   Highest education level: Not on file  Occupational History   Not on file  Tobacco Use   Smoking status: Former    Current packs/day: 0.00    Types: Cigarettes    Quit date: 1986    Years since quitting: 39.0   Smokeless tobacco: Not on file  Vaping Use   Vaping status: Never Used  Substance and Sexual Activity   Alcohol use: Yes    Alcohol/week: 1.0 standard drink of alcohol    Types: 1 Shots of liquor per week    Comment: a day   Drug use: Never   Sexual activity: Not on file  Other Topics Concern   Not on file  Social History Narrative  Not on file   Social Drivers of Health   Financial Resource Strain: Not on file  Food Insecurity: No Food Insecurity (07/19/2023)   Hunger Vital Sign    Worried About Running Out of Food in the Last Year: Never true    Ran Out of Food in the Last Year: Never true  Transportation Needs: No Transportation Needs (07/19/2023)   PRAPARE - Administrator, Civil Service (Medical): No    Lack of Transportation (Non-Medical): No  Physical Activity: Not on file  Stress: Not on file  Social Connections: Patient Declined (07/19/2023)   Social Connection and Isolation Panel [NHANES]    Frequency of Communication with Friends and Family: Patient declined    Frequency of Social  Gatherings with Friends and Family: Patient declined    Attends Religious Services: Patient declined    Active Member of Clubs or Organizations: Patient declined    Attends Banker Meetings: Patient declined    Marital Status: Patient declined  Intimate Partner Violence: Not At Risk (07/19/2023)   Humiliation, Afraid, Rape, and Kick questionnaire    Fear of Current or Ex-Partner: No    Emotionally Abused: No    Physically Abused: No    Sexually Abused: No    Functional Status Survey:    Family History  Problem Relation Age of Onset   Heart attack Mother    Parkinson's disease Mother    Heart disease Father    Coronary artery disease Father    Heart disease Sister     Health Maintenance  Topic Date Due   Medicare Annual Wellness (AWV)  Never done   Hepatitis C Screening  Never done   DTaP/Tdap/Td (3 - Tdap) 12/27/2021   INFLUENZA VACCINE  01/27/2023   COVID-19 Vaccine (8 - 2024-25 season) 02/27/2023   DEXA SCAN  06/08/2024 (Originally 08/31/2008)   Pneumonia Vaccine 47+ Years old  Completed   Zoster Vaccines- Shingrix  Completed   HPV VACCINES  Aged Out    Allergies  Allergen Reactions   Hydrogen Peroxide Swelling     facial swelling   Other Swelling    DUST   Beeswax Swelling and Rash    Outpatient Encounter Medications as of 07/25/2023  Medication Sig   acetaminophen (TYLENOL) 500 MG tablet Take 2 tablets (1,000 mg total) by mouth every 6 (six) hours.   alclomethasone (ACLOVATE) 0.05 % ointment Apply 1 Application topically 2 (two) times daily as needed (irritation).   aspirin 81 MG chewable tablet Chew 1 tablet (81 mg total) by mouth 2 (two) times daily for 28 days.   BIOTIN PO Take 1 capsule by mouth daily.   Cyanocobalamin (B-12 PO) Take 1 capsule by mouth daily.   methocarbamol (ROBAXIN) 500 MG tablet Take 1 tablet (500 mg total) by mouth every 6 (six) hours as needed for muscle spasms (thigh pain / muscle pain).   olmesartan (BENICAR) 5 MG tablet  Take 1 tablet (5 mg total) by mouth daily.   oxyCODONE (OXY IR/ROXICODONE) 5 MG immediate release tablet Take 1 tablet (5 mg total) by mouth every 4 (four) hours as needed for severe pain (pain score 7-10).   polyethylene glycol (MIRALAX / GLYCOLAX) 17 g packet Take 17 g by mouth 2 (two) times daily.   predniSONE (DELTASONE) 10 MG tablet Take 40 mg by mouth daily as needed (allergies around eyes / swollen eyes). Take for 3 days in a row   rosuvastatin (CRESTOR) 5 MG tablet Take 1 tablet by  mouth everyday at bedtime   senna (SENOKOT) 8.6 MG TABS tablet Take 2 tablets (17.2 mg total) by mouth at bedtime for 14 days.   Sodium Fluoride 1.1 % PSTE Apply pea size amount onto brush and apply to all surfaces of teeth for 2 minutes per day. (Patient taking differently: Place 1 Application onto teeth once a week.)   VITAMIN D PO Take 1 capsule by mouth daily.   hydrocortisone cream 1 % Apply 1 Application topically 2 (two) times daily as needed for itching. (Patient not taking: Reported on 07/25/2023)   No facility-administered encounter medications on file as of 07/25/2023.    Review of Systems  Constitutional:  Negative for activity change and appetite change.  HENT: Negative.    Respiratory:  Negative for cough and shortness of breath.   Cardiovascular:  Negative for leg swelling.  Gastrointestinal:  Negative for constipation.  Genitourinary: Negative.   Musculoskeletal:  Negative for arthralgias, gait problem and myalgias.  Skin: Negative.   Neurological:  Negative for dizziness and weakness.  Psychiatric/Behavioral:  Negative for confusion, dysphoric mood and sleep disturbance.     Vitals:   07/25/23 1635  BP: (!) 142/82  Pulse: 69  Resp: 16  Temp: 97.8 F (36.6 C)  TempSrc: Temporal  SpO2: 96%  Weight: 173 lb 12.8 oz (78.8 kg)  Height: 5\' 5"  (1.651 m)   Body mass index is 28.92 kg/m. Physical Exam Vitals reviewed.  Constitutional:      Appearance: Normal appearance.  HENT:      Head: Normocephalic.     Nose: Nose normal.     Mouth/Throat:     Mouth: Mucous membranes are moist.     Pharynx: Oropharynx is clear.  Eyes:     Pupils: Pupils are equal, round, and reactive to light.  Cardiovascular:     Rate and Rhythm: Normal rate and regular rhythm.     Pulses: Normal pulses.     Heart sounds: Normal heart sounds. No murmur heard. Pulmonary:     Effort: Pulmonary effort is normal.     Breath sounds: Normal breath sounds.  Abdominal:     General: Abdomen is flat. Bowel sounds are normal.     Palpations: Abdomen is soft.  Musculoskeletal:        General: No swelling.     Cervical back: Neck supple.     Comments: Right Leg Mild Swelling Not red or tender Some Bruising around the Knee  Skin:    General: Skin is warm.  Neurological:     General: No focal deficit present.     Mental Status: She is alert and oriented to person, place, and time.  Psychiatric:        Mood and Affect: Mood normal.        Thought Content: Thought content normal.     Labs reviewed: Basic Metabolic Panel: Recent Labs    07/12/23 1030 07/20/23 0335  NA 139 138  K 5.2* 4.6  CL 107 108  CO2 25 25  GLUCOSE 97 136*  BUN 17 15  CREATININE 0.85 0.69  CALCIUM 9.5 8.9   Liver Function Tests: No results for input(s): "AST", "ALT", "ALKPHOS", "BILITOT", "PROT", "ALBUMIN" in the last 8760 hours. No results for input(s): "LIPASE", "AMYLASE" in the last 8760 hours. No results for input(s): "AMMONIA" in the last 8760 hours. CBC: Recent Labs    07/12/23 1030 07/20/23 0335  WBC 5.4 13.5*  HGB 13.8 10.5*  HCT 42.4 32.1*  MCV 93.8 93.0  PLT 198 163   Cardiac Enzymes: No results for input(s): "CKTOTAL", "CKMB", "CKMBINDEX", "TROPONINI" in the last 8760 hours. BNP: Invalid input(s): "POCBNP" No results found for: "HGBA1C" Lab Results  Component Value Date   TSH 5.49 06/11/2021   No results found for: "VITAMINB12" No results found for: "FOLATE" No results found for:  "IRON", "TIBC", "FERRITIN"  Imaging and Procedures obtained prior to SNF admission: DG Pelvis Portable Result Date: 07/19/2023 CLINICAL DATA:  Postop. EXAM: PORTABLE PELVIS 1-2 VIEWS COMPARISON:  None Available. FINDINGS: Right hip arthroplasty in expected alignment. No periprosthetic lucency or fracture. Recent postsurgical change includes air and edema in the soft tissues. IMPRESSION: Right hip arthroplasty without immediate postoperative complication. Electronically Signed   By: Narda Rutherford M.D.   On: 07/19/2023 11:28   DG HIP UNILAT WITH PELVIS 1V RIGHT Result Date: 07/19/2023 CLINICAL DATA:  Elective surgery. EXAM: DG HIP (WITH OR WITHOUT PELVIS) 1V RIGHT COMPARISON:  None Available. FINDINGS: Two fluoroscopic spot views of the pelvis and right hip obtained in the operating room. Images during hip arthroplasty. Fluoroscopy time 12 seconds. Dose 1.5 mGy. IMPRESSION: Intraoperative fluoroscopy during right hip arthroplasty. Electronically Signed   By: Narda Rutherford M.D.   On: 07/19/2023 11:28   DG C-Arm 1-60 Min-No Report Result Date: 07/19/2023 Fluoroscopy was utilized by the requesting physician.  No radiographic interpretation.    Assessment/Plan 1. History of total right hip replacement (Primary) WBAT On Aspirin BID Bruising due to Tight stockings Swelling Mild  Will continue to monitor Pain Control with Tylenol Only She will try to keep it around 3 gms Does not want to go to her Apartment till she can walk back and forth to see her husband who is in health care  2. Essential hypertension Benicar  3. Mixed hyperlipidemia Crestor  4. Slow transit constipation Miralax  5. Anemia, unspecified type Post Op    Family/ staff Communication:   Labs/tests ordered:

## 2023-07-26 ENCOUNTER — Encounter: Payer: Self-pay | Admitting: Orthopedic Surgery

## 2023-07-26 ENCOUNTER — Non-Acute Institutional Stay (SKILLED_NURSING_FACILITY): Payer: Medicare Other | Admitting: Orthopedic Surgery

## 2023-07-26 DIAGNOSIS — Z96641 Presence of right artificial hip joint: Secondary | ICD-10-CM

## 2023-07-26 DIAGNOSIS — M25561 Pain in right knee: Secondary | ICD-10-CM

## 2023-07-26 NOTE — Progress Notes (Signed)
Location:  [facility] Room Number: 157/A Place of Service:  SNF (31) Provider:  Octavia Heir, NP   Aliene Beams, MD  Patient Care Team: Aliene Beams, MD as PCP - General (Family Medicine) Jake Bathe, MD as PCP - Cardiology (Cardiology)  Extended Emergency Contact Information Primary Emergency Contact: [facility] Phone: [phone] Mobile Phone: [phone] Relation: Spouse Secondary Emergency Contact: White,Dusty Mobile Phone: [phone] Relation: Nephew  Code Status: DNR Goals of care: Advanced Directive information    07/19/2023    1:02 PM  Advanced Directives  Does Patient Have a Medical Advance Directive? Yes  Type of Estate agent of [place];Living will  Does patient want to make changes to medical advance directive? No - Patient declined  Copy of Healthcare Power of Attorney in Chart? No - copy requested     Chief Complaint  Patient presents with   Acute Visit    Right knee pain    HPI:  Pt is a 78 y.o. female seen today for acute visit due to right knee pain.   She currently resides on the rehabilitation unit at [facility]. PMH: HTN, HLD, OA, anemia, palpitation, hyperkalemia, constipation and vitamin D deficiency.   01/21 she underwent elective right total hip arthroplasty by Dr. Charlann Boxer. 01/22 she was discharged from [facility] hospital and transferred to [facility] rehab unit for additional PT/OT/nursing services. Today, she c/o right knee clicking after using compression hose. She believes new compression hose provided by [facility] were too tight. When stocking was removed, she had increased bruising to anterior/posterior knee. She is taking asa twice daily for DVT prophylaxis. She denies pain to right knee but does admit to mild discomfort. Right hip pain controlled with tylenol and robaxin. She has oxycodone prn for breakthrough pain. Ambulating well with FWW. No recent falls or injuries. Treatment  options discussed, she plans to discuss with Dr. Charlann Boxer next week at follow up visit. Afebrile. Vitals stable.    Past Medical History:  Diagnosis Date   Abnormal mammogram    Allergic reaction to substance    Allergy    Arthritis    Environmental allergies    Hyperkalemia    Hyperlipidemia    Hypertension    Intermittent palpitations    Mass of right breast on mammogram    Palpitation    Past Surgical History:  Procedure Laterality Date   TOTAL HIP ARTHROPLASTY Right 07/19/2023   Procedure: TOTAL HIP ARTHROPLASTY ANTERIOR APPROACH;  Surgeon: Durene Romans, MD;  Location: [facility] ORS;  Service: Orthopedics;  Laterality: Right;   WISDOM TOOTH EXTRACTION      Allergies  Allergen Reactions   Hydrogen Peroxide Swelling     facial swelling   Other Swelling    DUST   Beeswax Swelling and Rash    Outpatient Encounter Medications as of 07/26/2023  Medication Sig   acetaminophen (TYLENOL) 500 MG tablet Take 2 tablets (1,000 mg total) by mouth every 6 (six) hours.   alclomethasone (ACLOVATE) 0.05 % ointment Apply 1 Application topically 2 (two) times daily as needed (irritation).   aspirin 81 MG chewable tablet Chew 1 tablet (81 mg total) by mouth 2 (two) times daily for 28 days.   BIOTIN PO Take 1 capsule by mouth daily.   Cyanocobalamin (B-12 PO) Take 1 capsule by mouth daily.   methocarbamol (ROBAXIN) 500 MG tablet Take 1 tablet (500 mg total) by mouth every 6 (six) hours as needed for muscle spasms (thigh pain / muscle pain).  olmesartan (BENICAR) 5 MG tablet Take 1 tablet (5 mg total) by mouth daily.   oxyCODONE (OXY IR/ROXICODONE) 5 MG immediate release tablet Take 1 tablet (5 mg total) by mouth every 4 (four) hours as needed for severe pain (pain score 7-10).   polyethylene glycol (MIRALAX / GLYCOLAX) 17 g packet Take 17 g by mouth 2 (two) times daily.   predniSONE (DELTASONE) 10 MG tablet Take 40 mg by mouth daily as needed (allergies around eyes / swollen eyes). Take for 3 days in  a row   rosuvastatin (CRESTOR) 5 MG tablet Take 1 tablet by mouth everyday at bedtime   senna (SENOKOT) 8.6 MG TABS tablet Take 2 tablets (17.2 mg total) by mouth at bedtime for 14 days.   Sodium Fluoride 1.1 % PSTE Apply pea size amount onto brush and apply to all surfaces of teeth for 2 minutes per day. (Patient taking differently: Place 1 Application onto teeth once a week.)   VITAMIN D PO Take 1 capsule by mouth daily.   No facility-administered encounter medications on file as of 07/26/2023.    Review of Systems  Constitutional:  Negative for activity change and fever.  Respiratory:  Negative for shortness of breath.   Cardiovascular:  Negative for chest pain.  Gastrointestinal:  Negative for abdominal distention, abdominal pain and constipation.  Musculoskeletal:  Positive for arthralgias, gait problem and joint swelling.  Psychiatric/Behavioral:  Negative for decreased concentration. The patient is not nervous/anxious.     Immunization History  Administered Date(s) Administered   DTaP 12/28/2011   Fluad Quad(high Dose 65+) 03/30/2022   Hepatitis A, Adult 04/04/2017   Influenza, High Dose Seasonal PF 08/23/2014, 03/28/2015, 05/17/2016, 04/04/2017, 04/13/2018, 04/16/2020   Influenza-Unspecified 04/02/2019, 04/17/2021   MODERNA COVID-19 SARS-COV-2 PEDS BIVALENT BOOSTER 27yr-50yr 04/10/2021   Moderna Sars-Covid-2 Vaccination 08/24/2019, 09/21/2019, 04/24/2020, 10/30/2020   PFIZER(Purple Top)SARS-COV-2 Vaccination 07/27/2019, 08/20/2019   Pneumococcal Conjugate-13 01/11/2014, 02/25/2014   Pneumococcal Polysaccharide-23 06/28/2008, 03/05/2009   Td 12/09/2006   Zoster Recombinant(Shingrix) 04/16/2019, 06/26/2019   Zoster, Live 12/16/2011   Pertinent  Health Maintenance Due  Topic Date Due   INFLUENZA VACCINE  01/27/2023   DEXA SCAN  06/08/2024 (Originally 08/31/2008)      06/08/2021    1:23 PM  Fall Risk  Falls in the past year? 0  Was there an injury with Fall? 0  Fall Risk  Category Calculator 0  Fall Risk Category (Retired) Low  (RETIRED) Patient Fall Risk Level Low fall risk  Patient at Risk for Falls Due to No Fall Risks  Fall risk Follow up Falls evaluation completed   Functional Status Survey:    Vitals:   07/26/23 1239  BP: 131/77  Pulse: 73  Resp: 18  Temp: 97.7 F (36.5 C)  SpO2: 97%  Weight: 164 lb 6.4 oz (74.6 kg)  Height: 5\' 5"  (1.651 m)   Body mass index is 27.36 kg/m. Physical Exam Vitals reviewed.  Constitutional:      General: She is not in acute distress. HENT:     Head: Normocephalic.  Eyes:     General:        Right eye: No discharge.        Left eye: No discharge.  Cardiovascular:     Rate and Rhythm: Normal rate and regular rhythm.     Pulses: Normal pulses.     Heart sounds: Normal heart sounds.  Pulmonary:     Effort: Pulmonary effort is normal.     Breath sounds: Normal breath  sounds.  Musculoskeletal:     Cervical back: Neck supple.     Right knee: Swelling and ecchymosis present. No crepitus. Normal range of motion. No tenderness.     Comments: Mild bruising to lateral anterior/posterior right knee, non pitting edema to RLE, faint click palpated over MCL portion with knee flexion  Skin:    General: Skin is warm.     Capillary Refill: Capillary refill takes less than 2 seconds.     Comments: Right hip surgical dressing CDI  Neurological:     General: No focal deficit present.     Mental Status: She is alert and oriented to person, place, and time.  Psychiatric:        Mood and Affect: Mood normal.     Labs reviewed: Recent Labs    07/12/23 1030 07/20/23 0335  NA 139 138  K 5.2* 4.6  CL 107 108  CO2 25 25  GLUCOSE 97 136*  BUN 17 15  CREATININE 0.85 0.69  CALCIUM 9.5 8.9   No results for input(s): "AST", "ALT", "ALKPHOS", "BILITOT", "PROT", "ALBUMIN" in the last 8760 hours. Recent Labs    07/12/23 1030 07/20/23 0335  WBC 5.4 13.5*  HGB 13.8 10.5*  HCT 42.4 32.1*  MCV 93.8 93.0  PLT 198  163   Lab Results  Component Value Date   TSH 5.49 06/11/2021   No results found for: "HGBA1C" Lab Results  Component Value Date   CHOL 144 06/11/2021   HDL 36 06/11/2021   LDLCALC 77 06/11/2021   TRIG 152 06/11/2021    Significant Diagnostic Results in last 30 days:  DG Pelvis Portable Result Date: 07/19/2023 CLINICAL DATA:  Postop. EXAM: PORTABLE PELVIS 1-2 VIEWS COMPARISON:  None Available. FINDINGS: Right hip arthroplasty in expected alignment. No periprosthetic lucency or fracture. Recent postsurgical change includes air and edema in the soft tissues. IMPRESSION: Right hip arthroplasty without immediate postoperative complication. Electronically Signed   By: Narda Rutherford M.D.   On: 07/19/2023 11:28   DG HIP UNILAT WITH PELVIS 1V RIGHT Result Date: 07/19/2023 CLINICAL DATA:  Elective surgery. EXAM: DG HIP (WITH OR WITHOUT PELVIS) 1V RIGHT COMPARISON:  None Available. FINDINGS: Two fluoroscopic spot views of the pelvis and right hip obtained in the operating room. Images during hip arthroplasty. Fluoroscopy time 12 seconds. Dose 1.5 mGy. IMPRESSION: Intraoperative fluoroscopy during right hip arthroplasty. Electronically Signed   By: Narda Rutherford M.D.   On: 07/19/2023 11:28   DG C-Arm 1-60 Min-No Report Result Date: 07/19/2023 Fluoroscopy was utilized by the requesting physician.  No radiographic interpretation.    Assessment/Plan 1. Acute pain of right knee (Primary) - began 01/27 after tight compression stockings removed - mild bruising and swelling over lateral portion - mild click palpated over MCL region  - denies pain - plan to have Dr. Charlann Boxer examine next week at postop visit - will xray right knee in rehab if symptoms worsen - cont tylenol and robaxin prn  2. S/P total right hip arthroplasty - 01/21 RATH by Dr. Charlann Boxer - moving well in rehab - appetite good - pain controlled with tylenol and robaxin prn - no recent falls - cont asa for DVT prophylaxis - use  compression stockings from hospital     Family/ staff Communication: plan discussed with patient and nurse  Labs/tests ordered:  none

## 2023-08-02 NOTE — Discharge Summary (Signed)
Patient ID: Chloe Cobb MRN: [id number] DOB/AGE: August 04, 1945 78 y.o.  Admit date: 07/19/2023 Discharge date: 07/20/2023  Admission Diagnoses:  Right hip osteoarthritis  Discharge Diagnoses:  Principal Problem:   S/P total right hip arthroplasty   Past Medical History:  Diagnosis Date   Abnormal mammogram    Allergic reaction to substance    Allergy    Arthritis    Environmental allergies    Hyperkalemia    Hyperlipidemia    Hypertension    Intermittent palpitations    Mass of right breast on mammogram    Palpitation     Surgeries: Procedure(s): TOTAL HIP ARTHROPLASTY ANTERIOR APPROACH on 07/19/2023   Consultants:   Discharged Condition: Improved  Hospital Course: Chloe Cobb is an 78 y.o. female who was admitted 07/19/2023 for operative treatment ofS/P total right hip arthroplasty. Patient has severe unremitting pain that affects sleep, daily activities, and work/hobbies. After pre-op clearance the patient was taken to the operating room on 07/19/2023 and underwent  Procedure(s): TOTAL HIP ARTHROPLASTY ANTERIOR APPROACH.    Patient was given perioperative antibiotics:  Anti-infectives (From admission, onward)    Start     Dose/Rate Route Frequency Ordered Stop   07/19/23 1500  ceFAZolin (ANCEF) IVPB 2g/100 mL premix        2 g 200 mL/hr over 30 Minutes Intravenous Every 6 hours 07/19/23 1045 07/19/23 2054   07/19/23 0645  ceFAZolin (ANCEF) IVPB 2g/100 mL premix        2 g 200 mL/hr over 30 Minutes Intravenous On call to O.R. 07/19/23 4098 07/19/23 1191        Patient was given sequential compression devices, early ambulation, and chemoprophylaxis to prevent DVT. Patient worked with PT and was meeting their goals regarding safe ambulation and transfers.  Patient benefited maximally from hospital stay and there were no complications.    Recent vital signs: No data found.   Recent laboratory studies: No results for input(s): "WBC", "HGB", "HCT", "PLT", "NA",  "K", "CL", "CO2", "BUN", "CREATININE", "GLUCOSE", "INR", "CALCIUM" in the last 72 hours.  Invalid input(s): "PT", "2"   Discharge Medications:   Allergies as of 07/20/2023       Reactions   Hydrogen Peroxide Swelling    facial swelling   Other Swelling   DUST   Beeswax Swelling, Rash        Medication List     STOP taking these medications    ammonium lactate 12 % lotion Commonly known as: LAC-HYDRIN   CeraVe Crea   Fluocinolone Acetonide 0.01 % Oil   hydrocortisone 2.5 % cream   ibuprofen 800 MG tablet Commonly known as: ADVIL   ketoconazole 2 % cream Commonly known as: NIZORAL   traMADol 50 MG tablet Commonly known as: ULTRAM       TAKE these medications    acetaminophen 500 MG tablet Commonly known as: TYLENOL Take 2 tablets (1,000 mg total) by mouth every 6 (six) hours.   alclomethasone 0.05 % ointment Commonly known as: ACLOVATE Apply 1 Application topically 2 (two) times daily as needed (irritation).   aspirin 81 MG chewable tablet Chew 1 tablet (81 mg total) by mouth 2 (two) times daily for 28 days.   B-12 PO Take 1 capsule by mouth daily.   BIOTIN PO Take 1 capsule by mouth daily.   methocarbamol 500 MG tablet Commonly known as: ROBAXIN Take 1 tablet (500 mg total) by mouth every 6 (six) hours as needed for muscle spasms (thigh pain / muscle  pain).   olmesartan 5 MG tablet Commonly known as: BENICAR Take 1 tablet (5 mg total) by mouth daily.   oxyCODONE 5 MG immediate release tablet Commonly known as: Oxy IR/ROXICODONE Take 1 tablet (5 mg total) by mouth every 4 (four) hours as needed for severe pain (pain score 7-10).   polyethylene glycol 17 g packet Commonly known as: MIRALAX / GLYCOLAX Take 17 g by mouth 2 (two) times daily.   predniSONE 10 MG tablet Commonly known as: DELTASONE Take 40 mg by mouth daily as needed (allergies around eyes / swollen eyes). Take for 3 days in a row   rosuvastatin 5 MG tablet Commonly known as:  CRESTOR Take 1 tablet by mouth everyday at bedtime   senna 8.6 MG Tabs tablet Commonly known as: SENOKOT Take 2 tablets (17.2 mg total) by mouth at bedtime for 14 days.   Sodium Fluoride 5000 PPM 1.1 % Pste Generic drug: Sodium Fluoride Apply pea size amount onto brush and apply to all surfaces of teeth for 2 minutes per day. What changed:  how much to take how to take this when to take this   VITAMIN D PO Take 1 capsule by mouth daily.               Discharge Care Instructions  (From admission, onward)           Start     Ordered   07/20/23 0000  Change dressing       Comments: Maintain surgical dressing until follow up in the clinic. If the edges start to pull up, may reinforce with tape. If the dressing is no longer working, may remove and cover with gauze and tape, but must keep the area dry and clean.  Call with any questions or concerns.   07/20/23 0753            Diagnostic Studies: DG Pelvis Portable Result Date: 07/19/2023 CLINICAL DATA:  Postop. EXAM: PORTABLE PELVIS 1-2 VIEWS COMPARISON:  None Available. FINDINGS: Right hip arthroplasty in expected alignment. No periprosthetic lucency or fracture. Recent postsurgical change includes air and edema in the soft tissues. IMPRESSION: Right hip arthroplasty without immediate postoperative complication. Electronically Signed   By: Narda Rutherford M.D.   On: 07/19/2023 11:28   DG HIP UNILAT WITH PELVIS 1V RIGHT Result Date: 07/19/2023 CLINICAL DATA:  Elective surgery. EXAM: DG HIP (WITH OR WITHOUT PELVIS) 1V RIGHT COMPARISON:  None Available. FINDINGS: Two fluoroscopic spot views of the pelvis and right hip obtained in the operating room. Images during hip arthroplasty. Fluoroscopy time 12 seconds. Dose 1.5 mGy. IMPRESSION: Intraoperative fluoroscopy during right hip arthroplasty. Electronically Signed   By: Narda Rutherford M.D.   On: 07/19/2023 11:28   DG C-Arm 1-60 Min-No Report Result Date:  07/19/2023 Fluoroscopy was utilized by the requesting physician.  No radiographic interpretation.    Disposition: Discharge disposition: 01-Home or Self Care       Discharge Instructions     Call MD / Call 911   Complete by: As directed    If you experience chest pain or shortness of breath, CALL 911 and be transported to the hospital emergency room.  If you develope a fever above 101 F, pus (white drainage) or increased drainage or redness at the wound, or calf pain, call your surgeon's office.   Change dressing   Complete by: As directed    Maintain surgical dressing until follow up in the clinic. If the edges start to pull up, may  reinforce with tape. If the dressing is no longer working, may remove and cover with gauze and tape, but must keep the area dry and clean.  Call with any questions or concerns.   Constipation Prevention   Complete by: As directed    Drink plenty of fluids.  Prune juice may be helpful.  You may use a stool softener, such as Colace (over the counter) 100 mg twice a day.  Use MiraLax (over the counter) for constipation as needed.   Diet - low sodium heart healthy   Complete by: As directed    Increase activity slowly as tolerated   Complete by: As directed    Weight bearing as tolerated with assist device (walker, cane, etc) as directed, use it as long as suggested by your surgeon or therapist, typically at least 4-6 weeks.   Post-operative opioid taper instructions:   Complete by: As directed    POST-OPERATIVE OPIOID TAPER INSTRUCTIONS: It is important to wean off of your opioid medication as soon as possible. If you do not need pain medication after your surgery it is ok to stop day one. Opioids include: Codeine, Hydrocodone(Norco, Vicodin), Oxycodone(Percocet, oxycontin) and hydromorphone amongst others.  Long term and even short term use of opiods can cause: Increased pain response Dependence Constipation Depression Respiratory depression And more.   Withdrawal symptoms can include Flu like symptoms Nausea, vomiting And more Techniques to manage these symptoms Hydrate well Eat regular healthy meals Stay active Use relaxation techniques(deep breathing, meditating, yoga) Do Not substitute Alcohol to help with tapering If you have been on opioids for less than two weeks and do not have pain than it is ok to stop all together.  Plan to wean off of opioids This plan should start within one week post op of your joint replacement. Maintain the same interval or time between taking each dose and first decrease the dose.  Cut the total daily intake of opioids by one tablet each day Next start to increase the time between doses. The last dose that should be eliminated is the evening dose.      TED hose   Complete by: As directed    Use stockings (TED hose) for 2 weeks on both leg(s).  You may remove them at night for sleeping.        Contact information for follow-up providers     Durene Romans, MD. Go on 08/03/2023.   Specialty: Orthopedic Surgery Why: You are scheduled for first post op appt on Wednesday February 5 at 3:30pm. Contact information: [address] [place] [address] [phone]              Contact information for after-discharge care     Destination     [facility] .   Service: Skilled Nursing Contact information: [address] [place] [address] [phone]                      Signed: Cassandria Anger 08/02/2023, 10:04 AM

## 2023-08-23 ENCOUNTER — Ambulatory Visit: Payer: Medicare Other | Admitting: Cardiology

## 2023-08-23 ENCOUNTER — Encounter: Payer: Self-pay | Admitting: Orthopedic Surgery

## 2023-08-23 ENCOUNTER — Non-Acute Institutional Stay (SKILLED_NURSING_FACILITY): Payer: Self-pay | Admitting: Orthopedic Surgery

## 2023-08-23 DIAGNOSIS — Z96641 Presence of right artificial hip joint: Secondary | ICD-10-CM | POA: Diagnosis not present

## 2023-08-23 DIAGNOSIS — E782 Mixed hyperlipidemia: Secondary | ICD-10-CM | POA: Insufficient documentation

## 2023-08-23 DIAGNOSIS — I10 Essential (primary) hypertension: Secondary | ICD-10-CM | POA: Insufficient documentation

## 2023-08-23 NOTE — Progress Notes (Signed)
 Location:  [facility] Room Number: 540/A Place of Service:  SNF (31) Provider:  Octavia Heir, NP   Aliene Beams, MD  Patient Care Team: Aliene Beams, MD as PCP - General (Family Medicine) Jake Bathe, MD as PCP - Cardiology (Cardiology)  Extended Emergency Contact Information Primary Emergency Contact: [facility] Phone: [phone] Mobile Phone: [phone] Relation: Spouse Secondary Emergency Contact: White,Dusty Mobile Phone: [phone] Relation: Nephew  Code Status:  DNR Goals of care: Advanced Directive information    07/19/2023    1:02 PM  Advanced Directives  Does Patient Have a Medical Advance Directive? Yes  Type of Estate agent of [place];Living will  Does patient want to make changes to medical advance directive? No - Patient declined  Copy of Healthcare Power of Attorney in Chart? No - copy requested     Chief Complaint  Patient presents with   Discharge Note    HPI:  Pt is a 78 y.o. female seen today for discharge evaluation.   She currently resides on the rehabilitation unit at [facility]. PMH: HTN, HLD, OA, anemia, palpitation, hyperkalemia, constipation and vitamin D deficiency.   01/21 she underwent elective right total hip arthroplasty by Dr. Charlann Boxer. She is currently ambulating on her own and able to drive. WBAT. No recent falls. Pain tolerated with tylenol. She does have Percocet and robaxin PRN for breakthrough pain. Describes pain as intermittent "soreness." PT plans to discharge her from services. Completed asa BID x 30 days. Afebrile. Vitals stable.   BUN/creat 15/0.99, remains on olmesartan.   Remains on rosuvastatin for elevated cholesterol.     Past Medical History:  Diagnosis Date   Abnormal mammogram    Allergic reaction to substance    Allergy    Arthritis    Environmental allergies    Hyperkalemia    Hyperlipidemia    Hypertension    Intermittent  palpitations    Mass of right breast on mammogram    Palpitation    Past Surgical History:  Procedure Laterality Date   TOTAL HIP ARTHROPLASTY Right 07/19/2023   Procedure: TOTAL HIP ARTHROPLASTY ANTERIOR APPROACH;  Surgeon: Durene Romans, MD;  Location: [facility];  Service: Orthopedics;  Laterality: Right;   WISDOM TOOTH EXTRACTION      Allergies  Allergen Reactions   Hydrogen Peroxide Swelling     facial swelling   Other Swelling    DUST   Beeswax Swelling and Rash    Outpatient Encounter Medications as of 08/23/2023  Medication Sig   alclomethasone (ACLOVATE) 0.05 % ointment Apply 1 Application topically 2 (two) times daily as needed (irritation).   BIOTIN PO Take 1 capsule by mouth daily.   Cyanocobalamin (B-12 PO) Take 1 capsule by mouth daily.   methocarbamol (ROBAXIN) 500 MG tablet Take 1 tablet (500 mg total) by mouth every 6 (six) hours as needed for muscle spasms (thigh pain / muscle pain).   olmesartan (BENICAR) 5 MG tablet Take 1 tablet (5 mg total) by mouth daily.   oxyCODONE (OXY IR/ROXICODONE) 5 MG immediate release tablet Take 1 tablet (5 mg total) by mouth every 4 (four) hours as needed for severe pain (pain score 7-10).   polyethylene glycol (MIRALAX / GLYCOLAX) 17 g packet Take 17 g by mouth 2 (two) times daily.   predniSONE (DELTASONE) 10 MG tablet Take 40 mg by mouth daily as needed (allergies around eyes / swollen eyes). Take for 3 days in a row   rosuvastatin (CRESTOR) 5 MG tablet  Take 1 tablet by mouth everyday at bedtime   Sodium Fluoride 1.1 % PSTE Apply pea size amount onto brush and apply to all surfaces of teeth for 2 minutes per day. (Patient taking differently: Place 1 Application onto teeth once a week.)   VITAMIN D PO Take 1 capsule by mouth daily.   No facility-administered encounter medications on file as of 08/23/2023.    Review of Systems  Constitutional: Negative.   HENT: Negative.    Respiratory: Negative.    Cardiovascular: Negative.    Gastrointestinal: Negative.   Genitourinary: Negative.   Musculoskeletal:  Positive for arthralgias.  Skin: Negative.   Neurological: Negative.   Psychiatric/Behavioral: Negative.      Immunization History  Administered Date(s) Administered   DTaP 12/28/2011   Fluad Quad(high Dose 65+) 03/30/2022   Hepatitis A, Adult 04/04/2017   Influenza, High Dose Seasonal PF 08/23/2014, 03/28/2015, 05/17/2016, 04/04/2017, 04/13/2018, 04/16/2020   Influenza-Unspecified 04/02/2019, 04/17/2021   MODERNA COVID-19 SARS-COV-2 PEDS BIVALENT BOOSTER 63yr-70yr 04/10/2021   Moderna Sars-Covid-2 Vaccination 08/24/2019, 09/21/2019, 04/24/2020, 10/30/2020   PFIZER(Purple Top)SARS-COV-2 Vaccination 07/27/2019, 08/20/2019   Pneumococcal Conjugate-13 01/11/2014, 02/25/2014   Pneumococcal Polysaccharide-23 06/28/2008, 03/05/2009   Td 12/09/2006   Zoster Recombinant(Shingrix) 04/16/2019, 06/26/2019   Zoster, Live 12/16/2011   Pertinent  Health Maintenance Due  Topic Date Due   INFLUENZA VACCINE  01/27/2023   DEXA SCAN  06/08/2024 (Originally 08/31/2008)      06/08/2021    1:23 PM  Fall Risk  Falls in the past year? 0  Was there an injury with Fall? 0  Fall Risk Category Calculator 0  Fall Risk Category (Retired) Low  (RETIRED) Patient Fall Risk Level Low fall risk  Patient at Risk for Falls Due to No Fall Risks  Fall risk Follow up Falls evaluation completed   Functional Status Survey:    Vitals:   08/23/23 1525  BP: 134/77  Pulse: 78  Resp: 16  Temp: (!) 97.2 F (36.2 C)  SpO2: 94%  Weight: 162 lb 3.2 oz (73.6 kg)  Height: 5\' 5"  (1.651 m)   Body mass index is 26.99 kg/m. Physical Exam Vitals reviewed.  HENT:     Head: Normocephalic.  Eyes:     General:        Right eye: No discharge.        Left eye: No discharge.  Cardiovascular:     Rate and Rhythm: Normal rate and regular rhythm.     Pulses: Normal pulses.     Heart sounds: Normal heart sounds.  Pulmonary:     Effort:  Pulmonary effort is normal.     Breath sounds: Normal breath sounds.  Abdominal:     Palpations: Abdomen is soft.  Musculoskeletal:     Cervical back: Neck supple.     Right lower leg: No edema.     Left lower leg: No edema.  Skin:    General: Skin is warm.     Capillary Refill: Capillary refill takes less than 2 seconds.  Neurological:     General: No focal deficit present.     Mental Status: She is alert and oriented to person, place, and time.     Motor: No weakness.     Gait: Gait normal.  Psychiatric:        Mood and Affect: Mood normal.     Labs reviewed: Recent Labs    07/12/23 1030 07/20/23 0335  NA 139 138  K 5.2* 4.6  CL 107 108  CO2 25  25  GLUCOSE 97 136*  BUN 17 15  CREATININE 0.85 0.69  CALCIUM 9.5 8.9   No results for input(s): "AST", "ALT", "ALKPHOS", "BILITOT", "PROT", "ALBUMIN" in the last 8760 hours. Recent Labs    07/12/23 1030 07/20/23 0335  WBC 5.4 13.5*  HGB 13.8 10.5*  HCT 42.4 32.1*  MCV 93.8 93.0  PLT 198 163   Lab Results  Component Value Date   TSH 5.49 06/11/2021   No results found for: "HGBA1C" Lab Results  Component Value Date   CHOL 144 06/11/2021   HDL 36 06/11/2021   LDLCALC 77 06/11/2021   TRIG 152 06/11/2021    Significant Diagnostic Results in last 30 days:  No results found.  Assessment/Plan 1. S/P total right hip arthroplasty (Primary) - 01/21 RATH by Dr. Charlann Boxer - completed asa BID x 30 days for DVT prophylaxis - WBAT - ambulating on own and driving - pain controlled with tylenol - cont percocet and robaxin prn for breakthrough pain - ok to discharge home when ready - continue HEP  2. Essential hypertension - controlled with olmesartan  3. Mixed hyperlipidemia - stable with rosuvastatin    Family/ staff Communication: plan discussed with patient and nurse  Labs/tests ordered:  none

## 2023-10-08 ENCOUNTER — Other Ambulatory Visit (HOSPITAL_BASED_OUTPATIENT_CLINIC_OR_DEPARTMENT_OTHER): Payer: Self-pay

## 2023-10-09 ENCOUNTER — Other Ambulatory Visit: Payer: Self-pay

## 2023-10-12 ENCOUNTER — Other Ambulatory Visit (HOSPITAL_BASED_OUTPATIENT_CLINIC_OR_DEPARTMENT_OTHER): Payer: Self-pay

## 2023-10-12 MED ORDER — OLMESARTAN MEDOXOMIL 5 MG PO TABS
5.0000 mg | ORAL_TABLET | Freq: Every day | ORAL | 1 refills | Status: DC
  Filled 2023-10-12: qty 90, 90d supply, fill #0
  Filled 2024-02-07: qty 90, 90d supply, fill #1

## 2023-10-13 ENCOUNTER — Ambulatory Visit (HOSPITAL_BASED_OUTPATIENT_CLINIC_OR_DEPARTMENT_OTHER): Admitting: Cardiology

## 2023-10-13 VITALS — BP 106/62 | HR 61 | Ht 65.0 in | Wt 164.1 lb

## 2023-10-13 DIAGNOSIS — I471 Supraventricular tachycardia, unspecified: Secondary | ICD-10-CM | POA: Diagnosis not present

## 2023-10-13 DIAGNOSIS — I10 Essential (primary) hypertension: Secondary | ICD-10-CM | POA: Diagnosis not present

## 2023-10-13 NOTE — Progress Notes (Signed)
 Cardiology Office Note:  .   Date:  10/13/2023  ID:  Chloe Cobb, DOB [birth year], MRN [id number] PCP: Chloe Gander, MD  [facility] Providers Cardiologist:  Chloe Gathers, MD     History of Present Illness: Chloe Cobb   Chloe Cobb is a 78 y.o. female Discussed the use of AI scribe software for clinical note transcription with the patient, who gave verbal consent to proceed.  History of Present Illness She is an 78 year old female with supraventricular tachycardia who presents for follow-up.  She has a history of supraventricular tachycardia (SVT) with previous episodes of palpitations. An EKG on August 27, 2022, showed sinus bradycardia with a heart rate of 48 beats per minute. An echocardiogram on September 28, 2022, revealed an ejection fraction of 60 to 65 percent. A Xiopatch monitor in March 2024 recorded an average heart rate of 62 beats per minute, sinus rhythm with SVT runs, the fastest being 12 beats at 188 beats per minute, and the longest lasting 2 minutes and 14 seconds with an average heart rate of 136 beats per minute. Rare premature atrial contractions (PACs) and premature ventricular contractions (PVCs) were noted.  She is currently taking rosuvastatin 5 mg at bedtime and Benicar (olmesartan) 5 mg daily. She was previously advised to switch to metoprolol but was not informed of the reason for this change. She is uncomfortable with changing medications as she prefers minimal medication use and has been stable on her current regimen.  She experiences occasional 'little tremble' sensations but denies significant symptoms such as fainting or severe palpitations. She has been under stress recently due to her husband's passing two weeks ago, which may have contributed to her symptoms.  She has noticed a small bump of fluid on right lateral side of ankle that does not seem to go away, although it reduces at night. Looks like a normal bursa. She wears compression socks sometimes,  especially when flying, to help with this issue.    Studies Reviewed: Chloe Cobb   EKG Interpretation Date/Time:  Thursday October 13 2023 15:34:41 EDT Ventricular Rate:  58 PR Interval:    QRS Duration:  72 QT Interval:  408 QTC Calculation: 400 R Axis:   -53  Text Interpretation: Sinus rhythm Left anterior fascicular block Cannot rule out Anterior infarct , age undetermined No previous ECGs available Confirmed by Chloe Cobb ([id number]) on 10/13/2023 3:45:53 PM    Results LABS LDL: 84 mg/dL Hb: 19.1 g/dL Cr: 0.7 mg/dL ALT: 25 U/L  DIAGNOSTIC EKG: Sinus bradycardia, heart rate 48 bpm (08/27/2022) Echocardiography: Ejection fraction 60-65% (09/28/2022) Xyopatch: Average heart rate 62 bpm, sinus rhythm with runs of supraventricular tachycardia, fastest 12 beats at 188 bpm, longest episode 2 minutes 14 seconds with average heart rate 136 bpm, rare premature atrial contractions and premature ventricular contractions (08/2022) Risk Assessment/Calculations:            Physical Exam:   VS:  BP 106/62   Pulse 61   Ht 5\' 5"  (1.651 m)   Wt 164 lb 1.6 oz (74.4 kg)   SpO2 97%   BMI 27.31 kg/m    Wt Readings from Last 3 Encounters:  10/13/23 164 lb 1.6 oz (74.4 kg)  08/23/23 162 lb 3.2 oz (73.6 kg)  07/26/23 164 lb 6.4 oz (74.6 kg)    GEN: Well nourished, well developed in no acute distress NECK: No JVD; No carotid bruits CARDIAC: RRR, no murmurs, no rubs, no gallops RESPIRATORY:  Clear to auscultation without rales,  wheezing or rhonchi  ABDOMEN: Soft, non-tender, non-distended EXTREMITIES:  No edema; No deformity   ASSESSMENT AND PLAN: .    Assessment and Plan Assessment & Plan Supraventricular Tachycardia (SVT)   Recent Xiopatch monitoring showing sinus rhythm and brief SVT episodes, the fastest at 188 bpm and the longest lasting 2 minutes and 14 seconds with an average heart rate of 136 bpm. Currently asymptomatic with no significant palpitations, skipped beats, or tachycardia. SVT  episodes are non-threatening and do not necessitate medication changes unless significant symptoms develop.   - Continue rosuvastatin 5 mg at bedtime and olmesartan 5 mg daily   - Monitor for increased palpitations or skipped beats    Medication Management   She prefers and benefits from continuing rosuvastatin and olmesartan, as they are effective and she is well-managed.   - Continue rosuvastatin 5 mg at bedtime and olmesartan 5 mg daily   - Reassess if symptoms change or if increased palpitations or skipped beats occur. For now ok to be off of metoprolol.     General Cardiovascular Health   Cardiovascular markers are well-controlled. Blood pressure is managed, LDL cholesterol is 84, hemoglobin is 13.1, creatinine is 0.7, and ALT is 25, indicating normal renal and hepatic function. Asymptomatic with no edema or heart failure signs.   - Encourage continued use of compression socks as needed, especially during flights    Follow-up   She is well-managed and does not require regular cardiology follow-up unless symptoms change.   - Provide a summary of current health status   - Advise to report any symptom changes or concerns         Signed, Chloe Gathers, MD

## 2023-10-13 NOTE — Patient Instructions (Signed)
 Medication Instructions:  The current medical regimen is effective;  continue present plan and medications.  *If you need a refill on your cardiac medications before your next appointment, please call your pharmacy*   Follow-Up: At [facility], you and your health needs are our priority.  As part of our continuing mission to provide you with exceptional heart care, our providers are all part of one team.  This team includes your primary Cardiologist (physician) and Advanced Practice Providers or APPs (Physician Assistants and Nurse Practitioners) who all work together to provide you with the care you need, when you need it.  Your next appointment:   Follow up as needed   We recommend signing up for the patient portal called "MyChart".  Sign up information is provided on this After Visit Summary.  MyChart is used to connect with patients for Virtual Visits (Telemedicine).  Patients are able to view lab/test results, encounter notes, upcoming appointments, etc.  Non-urgent messages can be sent to your provider as well.   To learn more about what you can do with MyChart, go to [email or web address].

## 2023-11-25 ENCOUNTER — Ambulatory Visit: Payer: Medicare Other | Admitting: Cardiology

## 2024-02-07 ENCOUNTER — Other Ambulatory Visit: Payer: Self-pay | Admitting: Adult Health

## 2024-02-07 ENCOUNTER — Other Ambulatory Visit (HOSPITAL_BASED_OUTPATIENT_CLINIC_OR_DEPARTMENT_OTHER): Payer: Self-pay

## 2024-02-07 ENCOUNTER — Encounter (HOSPITAL_BASED_OUTPATIENT_CLINIC_OR_DEPARTMENT_OTHER): Payer: Self-pay

## 2024-02-07 MED ORDER — ROSUVASTATIN CALCIUM 5 MG PO TABS
5.0000 mg | ORAL_TABLET | Freq: Every day | ORAL | 1 refills | Status: DC
  Filled 2024-02-07: qty 90, 90d supply, fill #0
  Filled 2024-05-04: qty 90, 90d supply, fill #1

## 2024-02-10 ENCOUNTER — Other Ambulatory Visit (HOSPITAL_BASED_OUTPATIENT_CLINIC_OR_DEPARTMENT_OTHER): Payer: Self-pay

## 2024-03-01 ENCOUNTER — Other Ambulatory Visit (HOSPITAL_BASED_OUTPATIENT_CLINIC_OR_DEPARTMENT_OTHER): Payer: Self-pay

## 2024-03-01 MED ORDER — ESTRADIOL 0.1 MG/GM VA CREA
TOPICAL_CREAM | VAGINAL | 1 refills | Status: AC
  Filled 2024-03-01: qty 42.5, 90d supply, fill #0

## 2024-04-13 ENCOUNTER — Other Ambulatory Visit (HOSPITAL_BASED_OUTPATIENT_CLINIC_OR_DEPARTMENT_OTHER): Payer: Self-pay

## 2024-04-16 ENCOUNTER — Other Ambulatory Visit (HOSPITAL_BASED_OUTPATIENT_CLINIC_OR_DEPARTMENT_OTHER): Payer: Self-pay

## 2024-04-17 ENCOUNTER — Other Ambulatory Visit (HOSPITAL_BASED_OUTPATIENT_CLINIC_OR_DEPARTMENT_OTHER): Payer: Self-pay

## 2024-04-17 MED ORDER — FLUOCINOLONE ACETONIDE 0.01 % OT OIL
5.0000 [drp] | TOPICAL_OIL | Freq: Two times a day (BID) | OTIC | 3 refills | Status: AC
Start: 2024-04-17 — End: ?
  Filled 2024-04-17: qty 20, 30d supply, fill #0
  Filled 2024-05-16: qty 20, 30d supply, fill #1

## 2024-05-04 ENCOUNTER — Other Ambulatory Visit: Payer: Self-pay

## 2024-05-04 ENCOUNTER — Other Ambulatory Visit (HOSPITAL_BASED_OUTPATIENT_CLINIC_OR_DEPARTMENT_OTHER): Payer: Self-pay

## 2024-05-04 ENCOUNTER — Other Ambulatory Visit: Payer: Self-pay | Admitting: Adult Health

## 2024-05-05 ENCOUNTER — Other Ambulatory Visit (HOSPITAL_BASED_OUTPATIENT_CLINIC_OR_DEPARTMENT_OTHER): Payer: Self-pay

## 2024-05-05 MED ORDER — OLMESARTAN MEDOXOMIL 5 MG PO TABS
5.0000 mg | ORAL_TABLET | Freq: Every day | ORAL | 1 refills | Status: AC
  Filled 2024-05-05: qty 90, 90d supply, fill #0
  Filled 2024-05-16: qty 90, 90d supply, fill #1
  Filled 2024-05-30: qty 30, 30d supply, fill #1
  Filled 2024-06-14: qty 30, 30d supply, fill #2

## 2024-05-16 ENCOUNTER — Other Ambulatory Visit: Payer: Self-pay

## 2024-05-16 ENCOUNTER — Other Ambulatory Visit (HOSPITAL_BASED_OUTPATIENT_CLINIC_OR_DEPARTMENT_OTHER): Payer: Self-pay

## 2024-05-17 ENCOUNTER — Other Ambulatory Visit (HOSPITAL_BASED_OUTPATIENT_CLINIC_OR_DEPARTMENT_OTHER): Payer: Self-pay

## 2024-05-17 MED ORDER — ROSUVASTATIN CALCIUM 5 MG PO TABS
5.0000 mg | ORAL_TABLET | Freq: Every day | ORAL | 1 refills | Status: AC
  Filled 2024-05-30: qty 30, 30d supply, fill #0
  Filled 2024-06-14: qty 30, 30d supply, fill #1

## 2024-05-22 ENCOUNTER — Other Ambulatory Visit (HOSPITAL_BASED_OUTPATIENT_CLINIC_OR_DEPARTMENT_OTHER): Payer: Self-pay

## 2024-05-22 MED ORDER — SULFAMETHOXAZOLE-TRIMETHOPRIM 800-160 MG PO TABS
1.0000 | ORAL_TABLET | Freq: Two times a day (BID) | ORAL | 1 refills | Status: AC
  Filled 2024-05-22: qty 10, 5d supply, fill #0
  Filled 2024-05-27: qty 10, 5d supply, fill #1

## 2024-05-22 MED ORDER — SCOPOLAMINE 1 MG/3DAYS TD PT72
1.0000 | MEDICATED_PATCH | TRANSDERMAL | 1 refills | Status: AC | PRN
  Filled 2024-05-22: qty 10, 30d supply, fill #0

## 2024-05-22 MED ORDER — ONDANSETRON HCL 4 MG PO TABS
4.0000 mg | ORAL_TABLET | Freq: Two times a day (BID) | ORAL | 0 refills | Status: AC | PRN
Start: 2024-05-22 — End: ?
  Filled 2024-05-22: qty 20, 10d supply, fill #0

## 2024-05-30 ENCOUNTER — Other Ambulatory Visit (HOSPITAL_BASED_OUTPATIENT_CLINIC_OR_DEPARTMENT_OTHER): Payer: Self-pay

## 2024-06-13 ENCOUNTER — Other Ambulatory Visit (HOSPITAL_BASED_OUTPATIENT_CLINIC_OR_DEPARTMENT_OTHER): Payer: Self-pay

## 2024-06-13 MED ORDER — HYDROCORTISONE 2.5 % EX CREA
1.0000 | TOPICAL_CREAM | Freq: Two times a day (BID) | CUTANEOUS | 0 refills | Status: AC
  Filled 2024-06-13: qty 60, 20d supply, fill #0

## 2024-06-13 MED ORDER — HYDROCORTISONE 2.5 % EX CREA
TOPICAL_CREAM | Freq: Two times a day (BID) | CUTANEOUS | 0 refills | Status: AC
  Filled 2024-06-13: qty 30, 30d supply, fill #0

## 2024-06-14 ENCOUNTER — Other Ambulatory Visit (HOSPITAL_BASED_OUTPATIENT_CLINIC_OR_DEPARTMENT_OTHER): Payer: Self-pay
